# Patient Record
Sex: Female | Born: 1990 | Race: Black or African American | Hispanic: No | Marital: Single | Smoking: Former smoker
Health system: Southern US, Community
[De-identification: ages and names within clinical notes are randomized; demographics above are authoritative.]

## PROBLEM LIST (undated history)

## (undated) ENCOUNTER — Emergency Department: Discharge status: Absent without leave | Payer: BLUE CROSS/BLUE SHIELD

---

## 2016-05-28 ENCOUNTER — Emergency Department: Payer: BLUE CROSS/BLUE SHIELD

## 2016-05-28 ENCOUNTER — Emergency Department
Admission: EM | Admit: 2016-05-28 | Discharge: 2016-05-28 | Disposition: A | Discharge status: Home / Self Care | Payer: BLUE CROSS/BLUE SHIELD | Attending: Emergency Medicine | Admitting: Emergency Medicine

## 2016-05-28 ENCOUNTER — Encounter: Payer: Self-pay | Admitting: Emergency Medicine

## 2016-05-28 DIAGNOSIS — F17200 Nicotine dependence, unspecified, uncomplicated: Secondary | ICD-10-CM | POA: Diagnosis not present

## 2016-05-28 DIAGNOSIS — R140 Abdominal distension (gaseous): Secondary | ICD-10-CM | POA: Insufficient documentation

## 2016-05-28 DIAGNOSIS — O200 Threatened abortion: Secondary | ICD-10-CM

## 2016-05-28 DIAGNOSIS — Z3A01 Less than 8 weeks gestation of pregnancy: Secondary | ICD-10-CM | POA: Diagnosis not present

## 2016-05-28 DIAGNOSIS — Z79899 Other long term (current) drug therapy: Secondary | ICD-10-CM | POA: Diagnosis not present

## 2016-05-28 DIAGNOSIS — R102 Pelvic and perineal pain unspecified side: Secondary | ICD-10-CM

## 2016-05-28 DIAGNOSIS — R1030 Lower abdominal pain, unspecified: Secondary | ICD-10-CM | POA: Insufficient documentation

## 2016-05-28 DIAGNOSIS — O26891 Other specified pregnancy related conditions, first trimester: Secondary | ICD-10-CM | POA: Diagnosis present

## 2016-05-28 DIAGNOSIS — O26899 Other specified pregnancy related conditions, unspecified trimester: Secondary | ICD-10-CM

## 2016-05-28 LAB — URINALYSIS COMPLETE WITH MICROSCOPIC (ARMC ONLY)
BILIRUBIN URINE: NEGATIVE
Bacteria, UA: NONE SEEN
GLUCOSE, UA: NEGATIVE mg/dL
Hgb urine dipstick: NEGATIVE
Ketones, ur: NEGATIVE mg/dL
LEUKOCYTES UA: NEGATIVE
NITRITE: NEGATIVE
Protein, ur: NEGATIVE mg/dL
RBC / HPF: NONE SEEN RBC/hpf (ref 0–5)
SPECIFIC GRAVITY, URINE: 1.02 (ref 1.005–1.030)
pH: 7 (ref 5.0–8.0)

## 2016-05-28 LAB — BASIC METABOLIC PANEL
ANION GAP: 8 (ref 5–15)
BUN: <5 mg/dL — ABNORMAL LOW (ref 6–20)
CALCIUM: 8.9 mg/dL (ref 8.9–10.3)
CO2: 21 mmol/L — AB (ref 22–32)
Chloride: 106 mmol/L (ref 101–111)
Creatinine, Ser: 0.6 mg/dL (ref 0.44–1.00)
Glucose, Bld: 87 mg/dL (ref 65–99)
Potassium: 3.7 mmol/L (ref 3.5–5.1)
Sodium: 135 mmol/L (ref 135–145)

## 2016-05-28 LAB — CBC WITH DIFFERENTIAL/PLATELET
Basophils Absolute: 0 10*3/uL (ref 0–0.1)
Basophils Relative: 0 %
EOS PCT: 3 %
Eosinophils Absolute: 0.2 10*3/uL (ref 0–0.7)
HEMATOCRIT: 33.1 % — AB (ref 35.0–47.0)
Hemoglobin: 11.2 g/dL — ABNORMAL LOW (ref 12.0–16.0)
LYMPHS ABS: 1.2 10*3/uL (ref 1.0–3.6)
LYMPHS PCT: 24 %
MCH: 24.7 pg — ABNORMAL LOW (ref 26.0–34.0)
MCHC: 33.9 g/dL (ref 32.0–36.0)
MCV: 72.9 fL — AB (ref 80.0–100.0)
MONO ABS: 0.3 10*3/uL (ref 0.2–0.9)
MONOS PCT: 6 %
NEUTROS ABS: 3.4 10*3/uL (ref 1.4–6.5)
Neutrophils Relative %: 67 %
PLATELETS: 251 10*3/uL (ref 150–440)
RBC: 4.54 MIL/uL (ref 3.80–5.20)
RDW: 16.5 % — ABNORMAL HIGH (ref 11.5–14.5)
WBC: 5.1 10*3/uL (ref 3.6–11.0)

## 2016-05-28 LAB — HCG, QUANTITATIVE, PREGNANCY: hCG, Beta Chain, Quant, S: 134547 m[IU]/mL — ABNORMAL HIGH (ref <5)

## 2016-05-28 MED ORDER — ONDANSETRON HCL 4 MG/2ML IJ SOLN: 4.0000 mg | Freq: Once | INTRAMUSCULAR | Status: DC

## 2016-05-28 MED ORDER — ONDANSETRON 4 MG PO TBDP
ORAL_TABLET | ORAL | Status: AC
  Administered 2016-05-28: 4 mg via ORAL
  Filled 2016-05-28: qty 1

## 2016-05-28 MED ORDER — ONDANSETRON 4 MG PO TBDP
4.0000 mg | ORAL_TABLET | Freq: Once | ORAL | Status: AC
  Administered 2016-05-28: 4 mg via ORAL

## 2016-05-28 MED ORDER — DOXYLAMINE-PYRIDOXINE 10-10 MG PO TBEC: DELAYED_RELEASE_TABLET | ORAL | Status: DC

## 2016-05-28 NOTE — ED Notes (Addendum)
Pt presents with abd bloating. Had been seen at the abortion clinic after finding out she was three weeks pregnant. Pt had taken two of the abortion pills last week to pass the fetus but states is having abd cramping and bloating.  Pt denies any vaginal bleeding at this time. Pt reports that she has not yet seen any signs of passing any tissue and is concerned. Pt has notified the abortion clinic and they told her to give it some time.
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
## 2016-05-28 NOTE — ED Notes (Signed)
Patient states she is leaving with her significant other who is here with the patient's two daughters. Patient called her mother in front of this RN, but was unable to get in touch with her at her mom's work. Patient did leave a message to have the mother call her at her house. Patient had used the phone earlier to talk to her mother and inform her of the pregnancy and make arrangements to bring the patient and her daughters home.

## 2016-05-28 NOTE — ED Provider Notes (Signed)
-----------------------------------------   5:09 PM on 05/28/2016 -----------------------------------------  Care was assumed Dr. Derrill KayGoodman at roughly 4 PM pending results of Rh. Patient's blood type is B+, no need for RhoGAM. Her ultrasound showed an intrauterine pregnancy at 8 weeks estimated gestational age. She is comfortable, not complaining of pain. I discussed return precautions with her, need for close OB/GYN follow-up and she is comfortable with the discharge plan. At this point, she is considering continuing on with the pregnancy. She is requesting a nausea medicine for morning sickness which I will prescribe. DC home.  Gayla DossEryka A Nikhita Mentzel, MD 05/28/16 1710

## 2016-05-28 NOTE — ED Notes (Signed)
Patient was roomed from triage and the nurse was informed that she had a new patient.

## 2016-05-28 NOTE — ED Notes (Signed)
Patient remains at ultrasound

## 2016-05-28 NOTE — Discharge Instructions (Signed)
Please seek medical attention for any high fevers, chest pain, shortness of breath, change in behavior, persistent vomiting, bloody stool or any other new or concerning symptoms. ° ° °Threatened Miscarriage °A threatened miscarriage is when you have vaginal bleeding during your first 20 weeks of pregnancy but the pregnancy has not ended. Your doctor will do tests to make sure you are still pregnant. The cause of the bleeding may not be known. This condition does not mean your pregnancy will end. It does increase the risk of it ending (complete miscarriage). °HOME CARE  °· Make sure you keep all your doctor visits for prenatal care. °· Get plenty of rest. °· Do not have sex or use tampons if you have vaginal bleeding. °· Do not douche. °· Do not smoke or use drugs. °· Do not drink alcohol. °· Avoid caffeine. °GET HELP IF: °· You have light bleeding from your vagina. °· You have belly pain or cramping. °· You have a fever. °GET HELP RIGHT AWAY IF:  °· You have heavy bleeding from your vagina. °· You have clots of blood coming from your vagina. °· You have bad pain or cramps in your low back or belly. °· You have fever, chills, and bad belly pain. °MAKE SURE YOU:  °· Understand these instructions. °· Will watch your condition. °· Will get help right away if you are not doing well or get worse. °  °This information is not intended to replace advice given to you by your health care provider. Make sure you discuss any questions you have with your health care provider. °  °Document Released: 11/12/2008 Document Revised: 12/05/2013 Document Reviewed: 09/26/2013 °Elsevier Interactive Patient Education ©2016 [employer] ° °

## 2016-05-28 NOTE — ED Provider Notes (Signed)
[facility] Emergency Department Provider Note   ____________________________________________   I have reviewed the triage vital signs and the nursing notes.   HISTORY  Chief Complaint Bloated and Abdominal Cramping   History limited by: Not Limited   HPI Wendy Brewer is a 25 y.o. female who presents to the emergency department today because of concerns for abdominal pain and bloating. This is located primarily in the lower abdomen. One week ago the patient took abortion pills. She thinks she was roughly 3-[redacted] weeks pregnant at that time. Since taking that medication the patient has only noticed a small amount of spotting. No other significant vaginal discharge. She has developed abdominal pain and bloating. Denies any fevers.   History reviewed. No pertinent past medical history.  There are no active problems to display for this patient.   History reviewed. No pertinent past surgical history.  No current outpatient prescriptions on file.  Allergies Review of patient's allergies indicates no known allergies.  No family history on file.  Social History Social History  Substance Use Topics  . Smoking status: Current Some Day Smoker  . Smokeless tobacco: None  . Alcohol Use: Yes    Review of Systems  Constitutional: Negative for fever. Cardiovascular: Negative for chest pain. Respiratory: Negative for shortness of breath. Gastrointestinal: Positive for lower abdominal pain and bloating Genitourinary: Negative for dysuria. Musculoskeletal: Negative for back pain. Skin: Negative for rash. Neurological: Negative for headaches, focal weakness or numbness.  10-point ROS otherwise negative.  ____________________________________________   PHYSICAL EXAM:  VITAL SIGNS: ED Triage Vitals  Enc Vitals Group     BP 05/28/16 1133 117/54 mmHg     Pulse Rate 05/28/16 1133 92     Resp 05/28/16 1133 18     Temp 05/28/16 1133 98.2 F (36.8 C)      Temp Source 05/28/16 1133 Oral     SpO2 05/28/16 1133 99 %     Weight 05/28/16 1133 157 lb (71.215 kg)     Height 05/28/16 1133  (1.702 m)     Head Cir --      Peak Flow --      Pain Score 05/28/16 1133 5   Constitutional: Alert and oriented. Well appearing and in no distress. Eyes: Conjunctivae are normal. PERRL. Normal extraocular movements. ENT   Head: Normocephalic and atraumatic.   Nose: No congestion/rhinnorhea.   Mouth/Throat: Mucous membranes are moist.   Neck: No stridor. Hematological/Lymphatic/Immunilogical: No cervical lymphadenopathy. Cardiovascular: Normal rate, regular rhythm.  No murmurs, rubs, or gallops. Respiratory: Normal respiratory effort without tachypnea nor retractions. Breath sounds are clear and equal bilaterally. No wheezes/rales/rhonchi. Gastrointestinal: Soft and Tender to palpation of the lower abdomen. Nondistended. No rebound. No guarding. Genitourinary: Deferred Musculoskeletal: Normal range of motion in all extremities. No joint effusions.  No lower extremity tenderness nor edema. Neurologic:  Normal speech and language. No gross focal neurologic deficits are appreciated.  Skin:  Skin is warm, dry and intact. No rash noted. Psychiatric: Mood and affect are normal. Speech and behavior are normal. Patient exhibits appropriate insight and judgment.  ____________________________________________    LABS (pertinent positives/negatives)  Labs Reviewed  CBC WITH DIFFERENTIAL/PLATELET - Abnormal; Notable for the following:    Hemoglobin 11.2 (*)    HCT 33.1 (*)    MCV 72.9 (*)    MCH 24.7 (*)    RDW 16.5 (*)    All other components within normal limits  HCG, QUANTITATIVE, PREGNANCY - Abnormal; Notable for the following:  hCG, Beta Chain, Quant, [place] [id number] (*)    All other components within normal limits  BASIC METABOLIC PANEL - Abnormal; Notable for the following:    CO2 21 (*)    BUN <5 (*)    All other components within  normal limits  URINALYSIS COMPLETEWITH MICROSCOPIC ([facility] ONLY) - Abnormal; Notable for the following:    Color, Urine YELLOW (*)    APPearance CLEAR (*)    Squamous Epithelial / LPF 0-5 (*)    All other components within normal limits     ____________________________________________   EKG  None  ____________________________________________    RADIOLOGY  US pending  ____________________________________________   PROCEDURES  Procedure(s) performed: None  Critical Care performed: No  ____________________________________________   INITIAL IMPRESSION / ASSESSMENT AND PLAN / ED COURSE  Pertinent labs & imaging results that were available during my care of the patient were reviewed by me and considered in my medical decision making (see chart for details).  Patient presented to the emergency department today because of concerns for lower abdominal pain 3-[redacted] weeks pregnant at that time. Patient states she just noticed a little bit of spotting however no significant vaginal bleeding. On exam she is minimally tender in the suprapubic region. Beta hCG is 134,000. Will get ultrasound.  ____________________________________________   FINAL CLINICAL IMPRESSION(S) / ED DIAGNOSES  Abdominal pain  Note: This dictation was prepared with Dragon dictation. Any transcriptional errors that result from this process are unintentional    Phineas SemenGraydon Emmalou Hunger, MD 05/28/16 1512

## 2016-05-29 LAB — ABO/RH: ABO/RH(D): B POS

## 2016-08-10 ENCOUNTER — Encounter: Payer: Self-pay | Admitting: *Deleted

## 2016-08-10 ENCOUNTER — Emergency Department
Admission: EM | Admit: 2016-08-10 | Discharge: 2016-08-10 | Disposition: A | Discharge status: Home / Self Care | Payer: BLUE CROSS/BLUE SHIELD | Attending: Emergency Medicine | Admitting: Emergency Medicine

## 2016-08-10 DIAGNOSIS — F17200 Nicotine dependence, unspecified, uncomplicated: Secondary | ICD-10-CM | POA: Insufficient documentation

## 2016-08-10 DIAGNOSIS — J45998 Other asthma: Secondary | ICD-10-CM | POA: Diagnosis not present

## 2016-08-10 DIAGNOSIS — J45909 Unspecified asthma, uncomplicated: Secondary | ICD-10-CM

## 2016-08-10 DIAGNOSIS — J029 Acute pharyngitis, unspecified: Secondary | ICD-10-CM | POA: Diagnosis present

## 2016-08-10 MED ORDER — ALBUTEROL SULFATE HFA 108 (90 BASE) MCG/ACT IN AERS: 2.0000 | INHALATION_SPRAY | Freq: Four times a day (QID) | RESPIRATORY_TRACT | 2 refills | Status: DC | PRN

## 2016-08-10 MED ORDER — CETIRIZINE HCL 10 MG PO CAPS: 10.0000 mg | ORAL_CAPSULE | Freq: Every day | ORAL | 3 refills | Status: DC

## 2016-08-10 NOTE — ED Triage Notes (Addendum)
Pt ambulatory to triage.  Pt states she has a sore throat and asthma.  Pt reports not using inhaler for years.  States wheezing today and may need an inhaler again.  No acute resp distress.  Pt alert.  Speech clear.

## 2016-08-10 NOTE — ED Provider Notes (Signed)
[facility] Emergency Department Provider Note  ____________________________________________  Time seen: Approximately 8:37 PM  I have reviewed the triage vital signs and the nursing notes.   HISTORY  Chief Complaint Sore Throat and Asthma   HPI Wendy Brewer is a 25 y.o. female who presents to the emergency department for evaluation of sore throat and wheezing. She states that night before last, she was awakened by wheezing and noticed that her throat was getting sore. She states that she relieved the wheezing by standing in the bathroom while running a hot shower. She reports a history of childhood asthma, but hasn't had any issues in several years. She denies recent illness, but believes that she has been having some issues with seasonal allergies. She has not taken any medications for symptom control.   No past medical history on file.  There are no active problems to display for this patient.   No past surgical history on file.  Current Outpatient Rx  . Order #: [id number] Class: Historical Med  . Order #: [id number] Class: Print    Allergies Review of patient's allergies indicates not on file.  No family history on file.  Social History Social History  Substance Use Topics  . Smoking status: Current Some Day Smoker  . Smokeless tobacco: Never Used  . Alcohol use Yes    Review of Systems Constitutional: Negative for fever/chills ENT: Positive for sore throat. Cardiovascular: Denies chest pain. Respiratory: Negative for shortness of breath. Negative for cough. Positive for wheezing Gastrointestinal: Negative nausea,  negative for vomiting.  Negative for diarrhea.  Musculoskeletal: Negative for body aches Skin: Negative for rash. Neurological: Negative for headaches ____________________________________________   PHYSICAL EXAM:  VITAL SIGNS: ED Triage Vitals  Enc Vitals Group     BP 08/10/16 2028 116/61     Pulse Rate 08/10/16  2028 83     Resp 08/10/16 2028 18     Temp 08/10/16 2028 98.6 F (37 C)     Temp Source 08/10/16 2028 Oral     SpO2 08/10/16 2028 100 %     Weight 08/10/16 2029 144 lb (65.3 kg)     Height 08/10/16 2029 5\' 7"  (1.702 m)     Head Circumference --      Peak Flow --      Pain Score --      Pain Loc --      Pain Edu? --      Excl. in GC? --     Constitutional: Alert and oriented. Well appearing and in no acute distress. Eyes: Conjunctivae are normal. EOMI. Ears: Bilateral tympanic membranes within normal limits Nose: No congestion; no rhinnorhea. Mouth/Throat: Mucous membranes are moist.  Oropharynx with cobblestoning and exudate. Tonsils appear without exudate. Neck: No stridor.  Lymphatic: No cervical lymphadenopathy. Cardiovascular: Normal rate, regular rhythm. Grossly normal heart sounds.  Good peripheral circulation. Respiratory: Normal respiratory effort.  No retractions. Scattered faint expiratory wheezes noted. Gastrointestinal: Soft and nontender.  Musculoskeletal: FROM x 4 extremities.  Neurologic:  Normal speech and language.  Skin:  Skin is warm, dry and intact. No rash noted. Psychiatric: Mood and affect are normal. Speech and behavior are normal.  ____________________________________________   LABS (all labs ordered are listed, but only abnormal results are displayed)  Labs Reviewed - No data to display ____________________________________________  EKG   ____________________________________________  RADIOLOGY  Not indicated ____________________________________________   PROCEDURES  Procedure(s) performed: None  Critical Care performed: No  ____________________________________________   INITIAL IMPRESSION / ASSESSMENT AND  PLAN / ED COURSE  Clinical Course    Pertinent labs & imaging results that were available during my care of the patient were reviewed by me and considered in my medical decision making (see chart for details).   Patient  advised to start taking cetirizine daily. She was given a prescription for albuterol inhaler and advised to use 2 puffs every 4-6 hours if needed. She was advised to use the inhaler prior to going to bed for the next few nights. She was strongly advised to establish a primary care provider that can monitor the amount of albuterol she is having to use. She was advised to return to the emergency department for any symptom that changes or worsen if she is unable to schedule an appointment. ____________________________________________   FINAL CLINICAL IMPRESSION(S) / ED DIAGNOSES  Final diagnoses:  None    Note:  This document was prepared using Dragon voice recognition software and may include unintentional dictation errors.     Chinita PesterCari B Ahad Colarusso, FNP 08/10/16 2206    Minna AntisKevin Paduchowski, MD 08/10/16 2234

## 2016-11-23 DIAGNOSIS — F331 Major depressive disorder, recurrent, moderate: Secondary | ICD-10-CM | POA: Insufficient documentation

## 2016-12-14 ENCOUNTER — Emergency Department
Admission: EM | Admit: 2016-12-14 | Discharge: 2016-12-14 | Disposition: A | Discharge status: Home / Self Care | Payer: BLUE CROSS/BLUE SHIELD | Attending: Emergency Medicine | Admitting: Emergency Medicine

## 2016-12-14 DIAGNOSIS — O99331 Smoking (tobacco) complicating pregnancy, first trimester: Secondary | ICD-10-CM | POA: Insufficient documentation

## 2016-12-14 DIAGNOSIS — O200 Threatened abortion: Secondary | ICD-10-CM | POA: Diagnosis not present

## 2016-12-14 DIAGNOSIS — Z3A01 Less than 8 weeks gestation of pregnancy: Secondary | ICD-10-CM | POA: Insufficient documentation

## 2016-12-14 DIAGNOSIS — F17200 Nicotine dependence, unspecified, uncomplicated: Secondary | ICD-10-CM | POA: Diagnosis not present

## 2016-12-14 DIAGNOSIS — O209 Hemorrhage in early pregnancy, unspecified: Secondary | ICD-10-CM | POA: Diagnosis present

## 2016-12-14 LAB — CBC WITH DIFFERENTIAL/PLATELET
BASOS ABS: 0 10*3/uL (ref 0–0.1)
BASOS PCT: 0 %
EOS ABS: 0.2 10*3/uL (ref 0–0.7)
EOS PCT: 2 %
HCT: 35.7 % (ref 35.0–47.0)
Hemoglobin: 12.2 g/dL (ref 12.0–16.0)
Lymphocytes Relative: 18 %
Lymphs Abs: 1.5 10*3/uL (ref 1.0–3.6)
MCH: 25.1 pg — ABNORMAL LOW (ref 26.0–34.0)
MCHC: 34.2 g/dL (ref 32.0–36.0)
MCV: 73.5 fL — AB (ref 80.0–100.0)
MONO ABS: 0.5 10*3/uL (ref 0.2–0.9)
Monocytes Relative: 6 %
Neutro Abs: 6.1 10*3/uL (ref 1.4–6.5)
Neutrophils Relative %: 74 %
PLATELETS: 266 10*3/uL (ref 150–440)
RBC: 4.86 MIL/uL (ref 3.80–5.20)
RDW: 16.1 % — AB (ref 11.5–14.5)
WBC: 8.3 10*3/uL (ref 3.6–11.0)

## 2016-12-14 LAB — COMPREHENSIVE METABOLIC PANEL
ALBUMIN: 3.7 g/dL (ref 3.5–5.0)
ALT: 11 U/L — ABNORMAL LOW (ref 14–54)
AST: 18 U/L (ref 15–41)
Alkaline Phosphatase: 47 U/L (ref 38–126)
Anion gap: 6 (ref 5–15)
BUN: 8 mg/dL (ref 6–20)
CHLORIDE: 107 mmol/L (ref 101–111)
CO2: 21 mmol/L — ABNORMAL LOW (ref 22–32)
Calcium: 9.3 mg/dL (ref 8.9–10.3)
Creatinine, Ser: 0.54 mg/dL (ref 0.44–1.00)
GFR calc Af Amer: >60 mL/min (ref >60)
Glucose, Bld: 80 mg/dL (ref 65–99)
POTASSIUM: 3.8 mmol/L (ref 3.5–5.1)
SODIUM: 134 mmol/L — AB (ref 135–145)
Total Bilirubin: 0.4 mg/dL (ref 0.3–1.2)
Total Protein: 7.8 g/dL (ref 6.5–8.1)

## 2016-12-14 LAB — URINALYSIS, ROUTINE W REFLEX MICROSCOPIC
Bilirubin Urine: NEGATIVE
GLUCOSE, UA: NEGATIVE mg/dL
Hgb urine dipstick: NEGATIVE
Ketones, ur: NEGATIVE mg/dL
LEUKOCYTES UA: NEGATIVE
Nitrite: NEGATIVE
PROTEIN: NEGATIVE mg/dL
Specific Gravity, Urine: 1.02 (ref 1.005–1.030)
pH: 6 (ref 5.0–8.0)

## 2016-12-14 LAB — HCG, QUANTITATIVE, PREGNANCY: HCG, BETA CHAIN, QUANT, S: 109579 m[IU]/mL — AB (ref <5)

## 2016-12-14 NOTE — ED Triage Notes (Signed)
Reports [redacted] wks pregnant.  Cramping, noted dark blood this am after taking a bath.

## 2016-12-14 NOTE — ED Provider Notes (Signed)
[facility] Emergency Department Provider Note  ____________________________________________   I have reviewed the triage vital signs and the nursing notes.   HISTORY  Chief Complaint Abdominal Cramping   History limited by: Not Limited   HPI Wendy Brewer is a 26 y.o. female who presents to the emergency department today because of concerns for abdominal cramping and vaginal bleeding. The patient had abdominal cramping last week. She went to [facility] that time. She states that she was told she was pregnant. She states she underwent a ultrasound which showed an intrauterine pregnancy. She was doing okay until today when the cramping started again. The patient also noticed bleeding today. Patient does have a miscarriage in the past. Patient denies noticing any other tissue passage.   History reviewed. No pertinent past medical history.  There are no active problems to display for this patient.   History reviewed. No pertinent surgical history.  Prior to Admission medications   Medication Sig Start Date End Date Taking? Authorizing Provider  albuterol (PROVENTIL HFA;VENTOLIN HFA) 108 (90 Base) MCG/ACT inhaler Inhale 2 puffs into the lungs every 6 (six) hours as needed for wheezing or shortness of breath. 08/10/16   Cari B Triplett, FNP  Cetirizine HCl 10 MG CAPS Take 1 capsule (10 mg total) by mouth daily. 08/10/16   Chinita Pester, FNP    Allergies Patient has no known allergies.  No family history on file.  Social History Social History  Substance Use Topics  . Smoking status: Current Some Day Smoker  . Smokeless tobacco: Never Used  . Alcohol use Yes    Review of Systems  Constitutional: Negative for fever. Cardiovascular: Negative for chest pain. Respiratory: Negative for shortness of breath. Gastrointestinal: Positive for lower abdominal cramping. Neurological: Negative for headaches, focal weakness or  numbness.  10-point ROS otherwise negative.  ____________________________________________   PHYSICAL EXAM:  VITAL SIGNS: ED Triage Vitals  Enc Vitals Group     BP 12/14/16 1548 (!) 107/48     Pulse Rate 12/14/16 1548 99     Resp 12/14/16 1548 20     Temp 12/14/16 1548 98 F (36.7 C)     Temp Source 12/14/16 1548 Oral     SpO2 12/14/16 1548 100 %     Weight 12/14/16 1548 157 lb (71.2 kg)     Height 12/14/16 1548 5\' 7"  (1.702 m)     Head Circumference --      Peak Flow --      Pain Score 12/14/16 1549 8   Constitutional: Alert and oriented. Well appearing and in no distress. Eyes: Conjunctivae are normal. Normal extraocular movements. ENT   Head: Normocephalic and atraumatic.   Nose: No congestion/rhinnorhea.   Mouth/Throat: Mucous membranes are moist.   Neck: No stridor. Hematological/Lymphatic/Immunilogical: No cervical lymphadenopathy. Cardiovascular: Normal rate, regular rhythm.  No murmurs, rubs, or gallops. Respiratory: Normal respiratory effort without tachypnea nor retractions. Breath sounds are clear and equal bilaterally. No wheezes/rales/rhonchi. Gastrointestinal: Soft and non tender. No rebound. No guarding.  Genitourinary: Deferred Musculoskeletal: Normal range of motion in all extremities. No lower extremity edema. Neurologic:  Normal speech and language. No gross focal neurologic deficits are appreciated.  Skin:  Skin is warm, dry and intact. No rash noted. Psychiatric: Mood and affect are normal. Speech and behavior are normal. Patient exhibits appropriate insight and judgment.  ____________________________________________    LABS (pertinent positives/negatives)  Labs Reviewed  CBC WITH DIFFERENTIAL/PLATELET - Abnormal; Notable for the following:  Result Value   MCV 73.5 (*)    MCH 25.1 (*)    RDW 16.1 (*)    All other components within normal limits  COMPREHENSIVE METABOLIC PANEL - Abnormal; Notable for the following:    Sodium  134 (*)    CO2 21 (*)    ALT 11 (*)    All other components within normal limits  HCG, QUANTITATIVE, PREGNANCY - Abnormal; Notable for the following:    hCG, Beta Chain, Quant, S 109,579 (*)    All other components within normal limits  URINALYSIS, ROUTINE W REFLEX MICROSCOPIC - Abnormal; Notable for the following:    Color, Urine YELLOW (*)    APPearance CLEAR (*)    All other components within normal limits     ____________________________________________   EKG  None  ____________________________________________    RADIOLOGY  None   ____________________________________________   PROCEDURES  Procedures  ____________________________________________   INITIAL IMPRESSION / ASSESSMENT AND PLAN / ED COURSE  Pertinent labs & imaging results that were available during my care of the patient were reviewed by me and considered in my medical decision making (see chart for details).  Patient presented to the emergency department today because of vaginal bleeding and cramping at roughly [redacted] weeks pregnant. Last week the patient had abdominal cramping and was seen at outside hospital. She states she had ultrasound done at that time which ruled out ectopic and showed an intrauterine pregnancy. Patient denies any other tissue or vaginal discharge. I discussed with the patient that the bleeding could represent a miscarriage. Also discussed that it is common to have bleeding early in pregnancy. Discussed that she will need to treat this as a pregnancy and have her beta hCG level repeated in 48 hours. Review does show the patient is B+ blood type.  ____________________________________________   FINAL CLINICAL IMPRESSION(S) / ED DIAGNOSES  Final diagnoses:  Threatened miscarriage     Note: This dictation was prepared with Dragon dictation. Any transcriptional errors that result from this process are unintentional     Phineas SemenGraydon Katya Rolston, MD 12/14/16 [phone]

## 2016-12-14 NOTE — ED Triage Notes (Signed)
Patient presents to the ED via EMS from home for abdominal cramping and spotting.  Patient is approx. [redacted] weeks pregnant.  Patient appears slightly uncomfortable.

## 2016-12-14 NOTE — Discharge Instructions (Signed)
Please seek medical attention for any high fevers, chest pain, shortness of breath, change in behavior, persistent vomiting, bloody stool or any other new or concerning symptoms.  

## 2016-12-15 ENCOUNTER — Encounter: Payer: Self-pay | Admitting: Emergency Medicine

## 2016-12-15 ENCOUNTER — Emergency Department
Admission: EM | Admit: 2016-12-15 | Discharge: 2016-12-15 | Disposition: A | Discharge status: Home / Self Care | Payer: BLUE CROSS/BLUE SHIELD | Attending: Emergency Medicine | Admitting: Emergency Medicine

## 2016-12-15 DIAGNOSIS — O99330 Smoking (tobacco) complicating pregnancy, unspecified trimester: Secondary | ICD-10-CM | POA: Diagnosis not present

## 2016-12-15 DIAGNOSIS — Z3A00 Weeks of gestation of pregnancy not specified: Secondary | ICD-10-CM | POA: Diagnosis not present

## 2016-12-15 DIAGNOSIS — F17200 Nicotine dependence, unspecified, uncomplicated: Secondary | ICD-10-CM | POA: Diagnosis not present

## 2016-12-15 DIAGNOSIS — R109 Unspecified abdominal pain: Secondary | ICD-10-CM

## 2016-12-15 DIAGNOSIS — Z79899 Other long term (current) drug therapy: Secondary | ICD-10-CM | POA: Diagnosis not present

## 2016-12-15 DIAGNOSIS — O200 Threatened abortion: Secondary | ICD-10-CM | POA: Diagnosis not present

## 2016-12-15 DIAGNOSIS — O26899 Other specified pregnancy related conditions, unspecified trimester: Secondary | ICD-10-CM | POA: Diagnosis present

## 2016-12-15 MED ORDER — ACETAMINOPHEN 325 MG PO TABS
650.0000 mg | ORAL_TABLET | Freq: Once | ORAL | Status: AC
  Administered 2016-12-15: 650 mg via ORAL
  Filled 2016-12-15: qty 2

## 2016-12-15 MED ORDER — RACEPINEPHRINE HCL 2.25 % IN NEBU: 0.2500 mL | INHALATION_SOLUTION | Freq: Once | RESPIRATORY_TRACT | Status: DC

## 2016-12-15 NOTE — Discharge Instructions (Signed)
As we discussed, your workup today was reassuring.  It appears that you have no emergent medical condition at this time and that you are safe to go home; please follow up as Dr. Derrill KayGoodman recommended with a prenatal provider for further evaluation of your pregnancy and abdominal cramping.  Please return immediately to the Emergency Department if you develop any new or worsening symptoms that concern you.

## 2016-12-15 NOTE — ED Triage Notes (Addendum)
Pt presents to ED abd cramping and dizziness. Pt was seen in this ED earlier this evening for the same in addition to vaginal bleeding and was dx with possible miscarriage. Pt was told it was too early to fetus on ultrasound and was instructed to follow up with her obgyn to have labs redrawn in a couple of days. Pt reports she is no longer bleeding. Pt states she has not eaten since she came to ED and is now feeling dizzy. Pt ambulatory to triage with steady gait. No increased work of breathing or distress noted.

## 2016-12-15 NOTE — ED Notes (Signed)
Pt found sleeping soundly in lobby 

## 2016-12-15 NOTE — ED Notes (Signed)
No answer when called from lobby 

## 2016-12-15 NOTE — ED Provider Notes (Signed)
[facility] Emergency Department Provider Note  ____________________________________________   First MD Initiated Contact with Patient 12/15/16 [phone]     (approximate)  I have reviewed the triage vital signs and the nursing notes.   HISTORY  Chief Complaint Dizziness and Abdominal Pain    HPI Wendy Brewer is a 26 y.o. female Who presents for evaluation of abdominal cramping and dizziness.  She was seen a few hours ago in this emergency department and worked up thoroughly by Dr. Derrill Kay.  Reportedly she did not have a ride home so she went back out to the lobby and has had nothing to eat for many hours.  She checked back in because she was hungry and feeling a little bit dizzy from not eating.  While she was waiting room she found a comfortable place and lied down and went to sleep for a few hours.  They then found her curled up sleeping and brought her to an exam room.  She states thather symptoms are the same as before with ome intermittent mild abdominal cramping.  She has had no additional vaginal bleeding.  Eyes fever/chills, chest pain, shortness of breath, nausea, vomiting.   History reviewed. No pertinent past medical history.  There are no active problems to display for this patient.   History reviewed. No pertinent surgical history.  Prior to Admission medications   Medication Sig Start Date End Date Taking? Authorizing Provider  albuterol (PROVENTIL HFA;VENTOLIN HFA) 108 (90 Base) MCG/ACT inhaler Inhale 2 puffs into the lungs every 6 (six) hours as needed for wheezing or shortness of breath. 08/10/16   Cari B Triplett, FNP  Cetirizine HCl 10 MG CAPS Take 1 capsule (10 mg total) by mouth daily. 08/10/16   Chinita Pester, FNP    Allergies Patient has no known allergies.  History reviewed. No pertinent family history.  Social History Social History  Substance Use Topics  . Smoking status: Current Some Day Smoker  . Smokeless tobacco:  Never Used  . Alcohol use Yes    Review of Systems Constitutional: No fever/chills Cardiovascular: Denies chest pain. Respiratory: Denies shortness of breath. Gastrointestinal: Lower abdominal cramping.  No nausea, no vomiting.  No diarrhea.  No constipation. Genitourinary: Negative for dysuria.  Vaginal bleeding yesterday Musculoskeletal: Negative for back pain. Neurological: Negative for headaches, focal weakness or numbness.  10-point ROS otherwise negative.  ____________________________________________   PHYSICAL EXAM:  VITAL SIGNS: ED Triage Vitals [12/15/16 0142]  Enc Vitals Group     BP (!) 109/45     Pulse Rate 73     Resp 18     Temp 98.3 F (36.8 C)     Temp Source Oral     SpO2 100 %     Weight 157 lb (71.2 kg)     Height 5\' 7"  (1.702 m)     Head Circumference      Peak Flow      Pain Score 7     Pain Loc      Pain Edu?      Excl. in GC?     Constitutional: Alert and oriented. Well appearing and in no acute distress. Eyes: Conjunctivae are normal. PERRL. EOMI. Head: Atraumatic. Nose: No congestion/rhinnorhea. Mouth/Throat: Mucous membranes are moist.  Oropharynx non-erythematous. Neck: No stridor.  No meningeal signs.   Cardiovascular: Normal rate, regular rhythm. Good peripheral circulation. Grossly normal heart sounds. Respiratory: Normal respiratory effort.  No retractions. Lungs CTAB. Gastrointestinal: Soft and nontender. No distention.  Musculoskeletal:  No lower extremity tenderness nor edema. No gross deformities of extremities. Neurologic:  Normal speech and language. No gross focal neurologic deficits are appreciated.  Skin:  Skin is warm, dry and intact. No rash noted. Psychiatric: Mood and affect are normal. Speech and behavior are normal.  ____________________________________________   LABS (all labs ordered are listed, but only abnormal results are displayed)  Labs Reviewed - No data to  display ____________________________________________  EKG  None - EKG not ordered by ED physician ____________________________________________  RADIOLOGY   No results found.  ____________________________________________   PROCEDURES  Procedure(s) performed:   Procedures   Critical Care performed: No ____________________________________________   INITIAL IMPRESSION / ASSESSMENT AND PLAN / ED COURSE  Pertinent labs & imaging results that were available during my care of the patient were reviewed by me and considered in my medical decision making (see chart for details).  the patient ate a full meal while waiting in the exam room for me to come see her.  She is in no distress and has no abdominal tenderness to palpation.  I encouraged her to follow up as Dr. Goodman and previously Derrill Kayrecommended.  We provided a bus pass and that she can get home and I observed her walking out of the emergency department with a steady gait and no distress.    ____________________________________________  FINAL CLINICAL IMPRESSION(S) / ED DIAGNOSES  Final diagnoses:  Threatened miscarriage  Abdominal cramping     MEDICATIONS GIVEN DURING THIS VISIT:  Medications  acetaminophen (TYLENOL) tablet 650 mg (650 mg Oral Given 12/15/16 0609)     NEW OUTPATIENT MEDICATIONS STARTED DURING THIS VISIT:  Discharge Medication List as of 12/15/2016  6:22 AM      Discharge Medication List as of 12/15/2016  6:22 AM      Discharge Medication List as of 12/15/2016  6:22 AM       Note:  This document was prepared using Dragon voice recognition software and may include unintentional dictation errors.    [name], MD 12/15/16 [phone]

## 2017-01-11 DIAGNOSIS — E663 Overweight: Secondary | ICD-10-CM | POA: Insufficient documentation

## 2017-01-11 DIAGNOSIS — F1520 Other stimulant dependence, uncomplicated: Secondary | ICD-10-CM | POA: Insufficient documentation

## 2017-02-14 ENCOUNTER — Emergency Department
Admission: EM | Admit: 2017-02-14 | Discharge: 2017-02-15 | Disposition: A | Discharge status: Home / Self Care | Payer: BLUE CROSS/BLUE SHIELD | Attending: Emergency Medicine | Admitting: Emergency Medicine

## 2017-02-14 ENCOUNTER — Emergency Department: Payer: BLUE CROSS/BLUE SHIELD

## 2017-02-14 ENCOUNTER — Encounter: Payer: Self-pay | Admitting: Emergency Medicine

## 2017-02-14 DIAGNOSIS — R102 Pelvic and perineal pain: Secondary | ICD-10-CM

## 2017-02-14 DIAGNOSIS — R197 Diarrhea, unspecified: Secondary | ICD-10-CM | POA: Insufficient documentation

## 2017-02-14 DIAGNOSIS — N938 Other specified abnormal uterine and vaginal bleeding: Secondary | ICD-10-CM | POA: Insufficient documentation

## 2017-02-14 DIAGNOSIS — R1030 Lower abdominal pain, unspecified: Secondary | ICD-10-CM | POA: Diagnosis present

## 2017-02-14 DIAGNOSIS — F17200 Nicotine dependence, unspecified, uncomplicated: Secondary | ICD-10-CM | POA: Insufficient documentation

## 2017-02-14 LAB — CBC
HEMATOCRIT: 35.1 % (ref 35.0–47.0)
Hemoglobin: 11.8 g/dL — ABNORMAL LOW (ref 12.0–16.0)
MCH: 25 pg — ABNORMAL LOW (ref 26.0–34.0)
MCHC: 33.5 g/dL (ref 32.0–36.0)
MCV: 74.7 fL — ABNORMAL LOW (ref 80.0–100.0)
PLATELETS: 280 10*3/uL (ref 150–440)
RBC: 4.7 MIL/uL (ref 3.80–5.20)
RDW: 15.3 % — AB (ref 11.5–14.5)
WBC: 8.4 10*3/uL (ref 3.6–11.0)

## 2017-02-14 LAB — WET PREP, GENITAL
Clue Cells Wet Prep HPF POC: NONE SEEN
Sperm: NONE SEEN
TRICH WET PREP: NONE SEEN
YEAST WET PREP: NONE SEEN

## 2017-02-14 LAB — ABO/RH: ABO/RH(D): B POS

## 2017-02-14 LAB — HCG, QUANTITATIVE, PREGNANCY: HCG, BETA CHAIN, QUANT, S: 13 m[IU]/mL — AB (ref <5)

## 2017-02-14 MED ORDER — MORPHINE SULFATE (PF) 4 MG/ML IV SOLN
4.0000 mg | Freq: Once | INTRAVENOUS | Status: AC
  Administered 2017-02-15: 4 mg via INTRAVENOUS
  Filled 2017-02-14: qty 1

## 2017-02-14 MED ORDER — SODIUM CHLORIDE 0.9 % IV BOLUS (SEPSIS)
1000.0000 mL | Freq: Once | INTRAVENOUS | Status: AC
  Administered 2017-02-14: 1000 mL via INTRAVENOUS

## 2017-02-14 MED ORDER — ONDANSETRON HCL 4 MG/2ML IJ SOLN
4.0000 mg | Freq: Once | INTRAMUSCULAR | Status: AC
  Administered 2017-02-14: 4 mg via INTRAVENOUS
  Filled 2017-02-14: qty 2

## 2017-02-14 MED ORDER — MORPHINE SULFATE (PF) 4 MG/ML IV SOLN
4.0000 mg | Freq: Once | INTRAVENOUS | Status: AC
  Administered 2017-02-14: 4 mg via INTRAVENOUS
  Filled 2017-02-14: qty 1

## 2017-02-14 NOTE — ED Notes (Signed)
Pt requesting additional pain medication. md notified, no new orders received.  

## 2017-02-14 NOTE — ED Triage Notes (Signed)
Pt presents to ED with c/o lower abdominal pain x 1 hr and vaginal bleeding x 1 hr. Pt states hx of miscarriage in January with a D&C and is on depo. Per EMS pt c/o nausea this even and diarrhea x a few days but denies any since yesterday. EMS reports pt went to the bathroom and had a gush of vaginal bleeding into the toilet, picture brought in by EMS depict a gelatinous substance in the toilet. EMS reports intermittent vaginal pain with coughing, reports hypotension on scene with a BP 90/50, resolved with 400CC NS.

## 2017-02-14 NOTE — ED Provider Notes (Signed)
[facility] Emergency Department Provider Note  ____________________________________________   First MD Initiated Contact with Patient 02/14/17 2115     (approximate)  I have reviewed the triage vital signs and the nursing notes.   HISTORY  Chief Complaint Abdominal Pain and Vaginal Bleeding   HPI Wendy Brewer is a 26 y.o. female with a history of spontaneous miscarriage this past January requiring a D&C thereafter who is presenting with vaginal bleeding as well as lower abdominal cramping. She says just prior to arrival she had vaginal bleeding with subsequent cramping thereafter. She says the pain is moderate to severe and coming and going to the lower abdomen. She says that she is also had diarrhea over the past several days. Also with blood in her urine over the past several days. Says that she is currently on Depo-Provera shots but has been having hCG hormone levels tracked ever since a miscarriage this past January.   History reviewed. No pertinent past medical history.  There are no active problems to display for this patient.   History reviewed. No pertinent surgical history.  Prior to Admission medications   Medication Sig Start Date End Date Taking? Authorizing Provider  albuterol (PROVENTIL HFA;VENTOLIN HFA) 108 (90 Base) MCG/ACT inhaler Inhale 2 puffs into the lungs every 6 (six) hours as needed for wheezing or shortness of breath. 08/10/16   Cari B Triplett, FNP  Cetirizine HCl 10 MG CAPS Take 1 capsule (10 mg total) by mouth daily. 08/10/16   Chinita Pester, FNP    Allergies Patient has no known allergies.  No family history on file.  Social History Social History  Substance Use Topics  . Smoking status: Current Some Day Smoker    Packs/day: 0.25  . Smokeless tobacco: Never Used  . Alcohol use Yes    Review of Systems Constitutional: No fever/chills Eyes: No visual changes. ENT: No sore throat. Cardiovascular: Denies chest  pain. Respiratory: Denies shortness of breath. Gastrointestinal: No nausea, no vomiting. No constipation. Genitourinary: Negative for dysuria. Musculoskeletal: Negative for back pain. Skin: Negative for rash. Neurological: Negative for headaches, focal weakness or numbness.  10-point ROS otherwise negative.  ____________________________________________   PHYSICAL EXAM:  VITAL SIGNS: ED Triage Vitals [02/14/17 2122]  Enc Vitals Group     BP 109/62     Pulse Rate 75     Resp 20     Temp 98.4 F (36.9 C)     Temp Source Oral     SpO2 100 %     Weight 162 lb (73.5 kg)     Height 5\' 7"  (1.702 m)     Head Circumference      Peak Flow      Pain Score 10     Pain Loc      Pain Edu?      Excl. in GC?     Constitutional: Alert and oriented. Patient appears uncomfortable but without distress. Eyes: Conjunctivae are normal. PERRL. EOMI. Head: Atraumatic. Nose: No congestion/rhinnorhea. Mouth/Throat: Mucous membranes are moist.  Neck: No stridor.   Cardiovascular: Normal rate, regular rhythm. Grossly normal heart sounds.  Good peripheral circulation. Respiratory: Normal respiratory effort.  No retractions. Lungs CTAB. Gastrointestinal: Soft with moderate tenderness across the lower abdomen. No distention.  Genitourinary:  Normal external genitalia without any lesions. Speculum exam with small clots in the vault but no pooling of blood. No active bleeding from the cervix. Bimanual exam with positive CMT as well as uterine and bilateral adnexal tenderness  without any masses palpated. Musculoskeletal: No lower extremity tenderness nor edema.  No joint effusions. Neurologic:  Normal speech and language. No gross focal neurologic deficits are appreciated.  Skin:  Skin is warm, dry and intact. No rash noted. Psychiatric: Mood and affect are normal. Speech and behavior are normal.  ____________________________________________   LABS (all labs ordered are listed, but only abnormal  results are displayed)  Labs Reviewed  WET PREP, GENITAL  CHLAMYDIA/NGC RT PCR ([facility] ONLY)  HCG, QUANTITATIVE, PREGNANCY  URINALYSIS, COMPLETE (UACMP) WITH MICROSCOPIC  CBC  ABO/RH   ____________________________________________  EKG   ____________________________________________  RADIOLOGY   ____________________________________________   PROCEDURES  Procedure(s) performed:   Procedures  Critical Care performed:   ____________________________________________   INITIAL IMPRESSION / ASSESSMENT AND PLAN / ED COURSE  Pertinent labs & imaging results that were available during my care of the patient were reviewed by me and considered in my medical decision making (see chart for details).  ----------------------------------------- 11:59 PM on 02/14/2017 -----------------------------------------  Patient requesting second dose of pain medication after the first dose of morphine. Hormone level appears to be 13 which is down to 100,000 this past January. Possible still trending of hormone downward. We will further evaluate the patient with ultrasound. Signed out to Dr. Dolores FrameSung.      ____________________________________________   FINAL CLINICAL IMPRESSION(S) / ED DIAGNOSES  Pelvic pain.  dys functional uterine bleeding. Diarrhea.    NEW MEDICATIONS STARTED DURING THIS VISIT:  New Prescriptions   No medications on file     Note:  This document was prepared using Dragon voice recognition software and may include unintentional dictation errors.    Myrna Blazeravid Matthew Daniel Ritthaler, MD 02/14/17 [phone]

## 2017-02-15 DIAGNOSIS — N938 Other specified abnormal uterine and vaginal bleeding: Secondary | ICD-10-CM | POA: Diagnosis not present

## 2017-02-15 LAB — CHLAMYDIA/NGC RT PCR (ARMC ONLY)
Chlamydia Tr: NOT DETECTED
N gonorrhoeae: NOT DETECTED

## 2017-02-15 MED ORDER — OXYCODONE-ACETAMINOPHEN 5-325 MG PO TABS: 1.0000 | ORAL_TABLET | ORAL | 0 refills | Status: DC | PRN

## 2017-02-15 MED ORDER — ONDANSETRON 4 MG PO TBDP
4.0000 mg | ORAL_TABLET | Freq: Three times a day (TID) | ORAL | 0 refills | Status: DC | PRN
Start: 2017-02-15 — End: 2018-09-26

## 2017-02-15 NOTE — Discharge Instructions (Signed)
1. You may take pain and nausea medicines as needed (Percocet/Zofran). 2. Your pregnancy level (hcg) today is 13. 3. Return to the ER for worsening symptoms, persistent vomiting, difficulty breathing or other concerns.

## 2017-02-15 NOTE — ED Provider Notes (Signed)
-----------------------------------------   1:19 AM on 02/15/2017 -----------------------------------------  Ultrasound interpreted per Dr. Clovis RileyMitchell:   No visible gestational sac. Moderate volume blood or fluid in the  pelvis. Endometrium is thickened and inhomogeneous. Recommend close  follow-up.   Updated patient and family member of ultrasound results. Reports pain is much better after morphine. Has follow-up appointment at health department next week; encouraged her to call in the morning to move her appointment to this week. Strict return precautions given. Both verbalize understanding and agree with plan of care.   Irean HongJade J Julitza Rickles, MD 02/15/17 [phone]

## 2017-02-15 NOTE — ED Notes (Signed)
Pt. Going home with friend 

## 2018-05-22 IMAGING — US US OB TRANSVAGINAL
1 series · 14 of 28 positions shown · non-contrast
Comparison: None.

CLINICAL DATA: Pelvic pain for 2 weeks, positive pregnancy test



[Series 1: us ob transvaginal · 0.19mm/px · 14 of 212 slices shown]
[im 8/212]
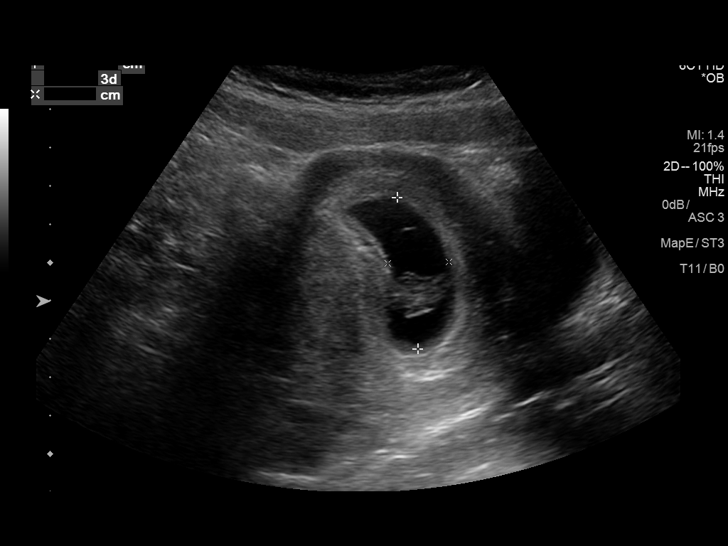
[im 24/212]
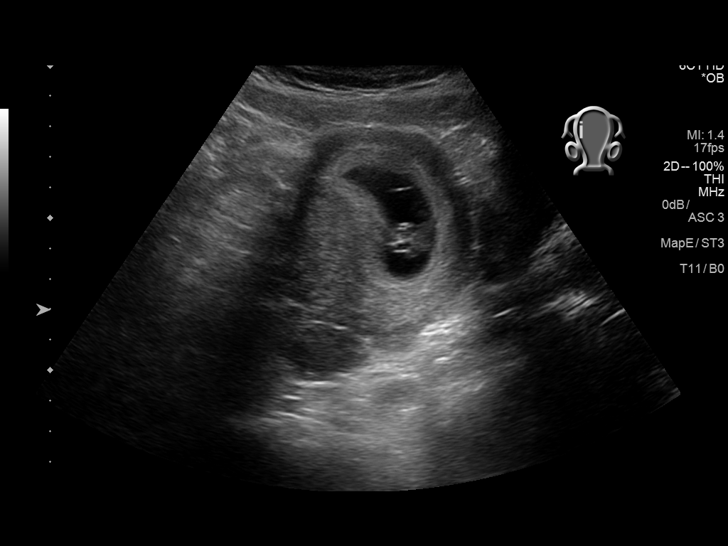
[im 40/212]
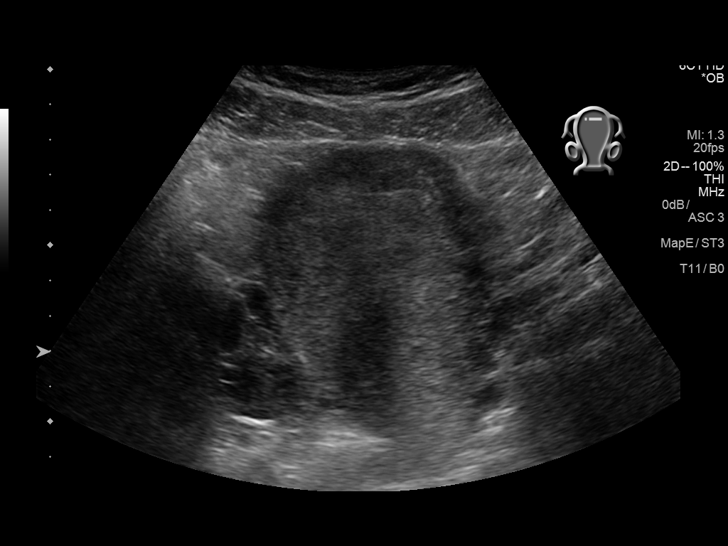
[im 55/212]
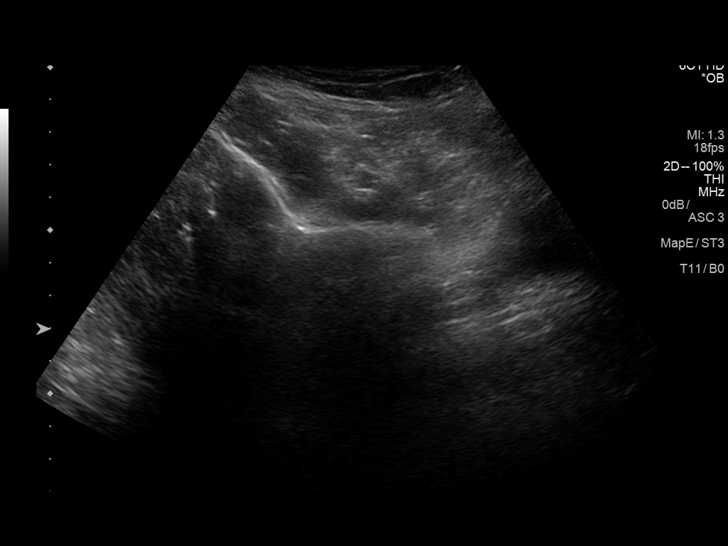
[im 71/212]
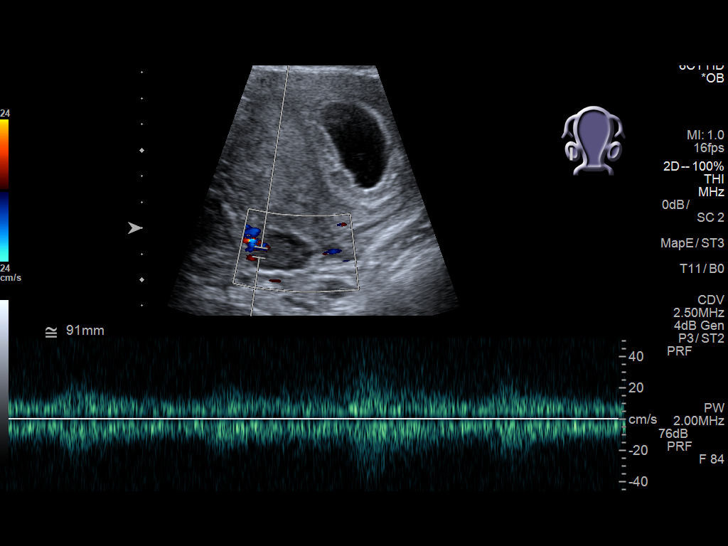
[im 86/212]
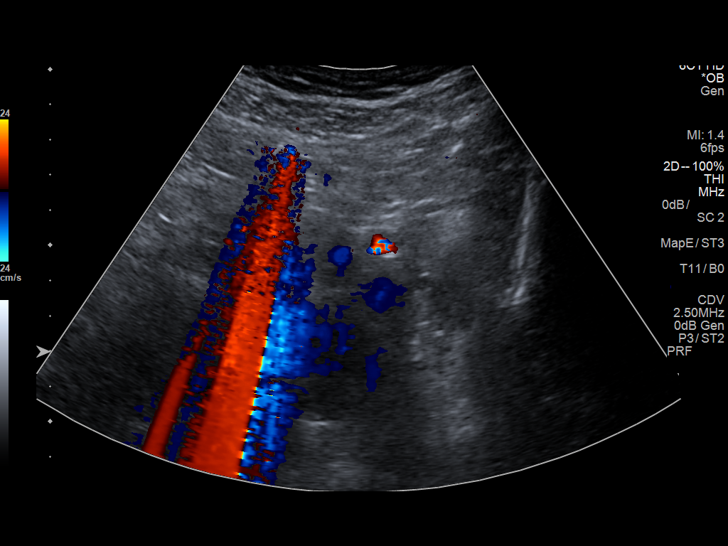
[im 102/212]
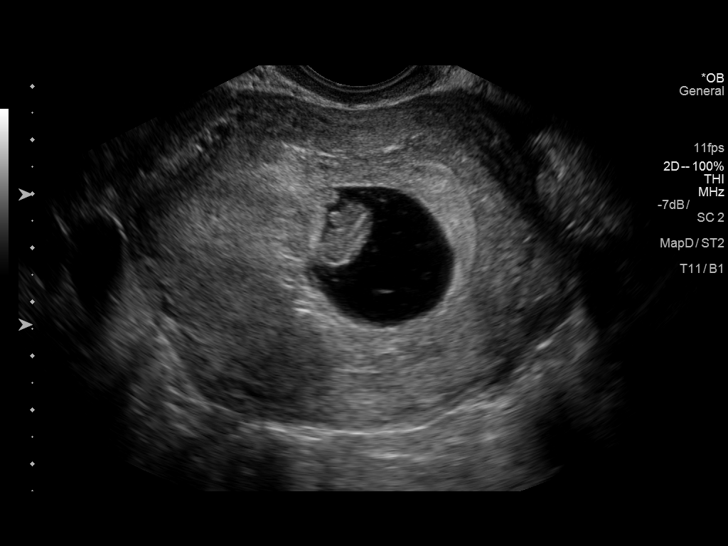
[im 118/212]
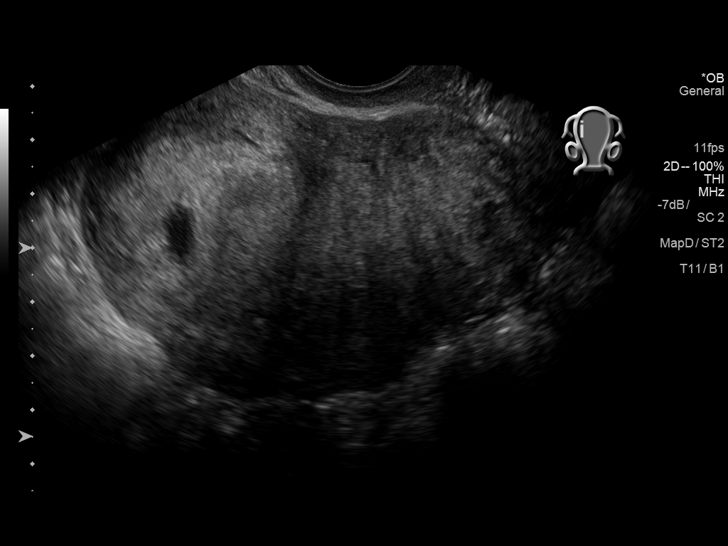
[im 133/212]
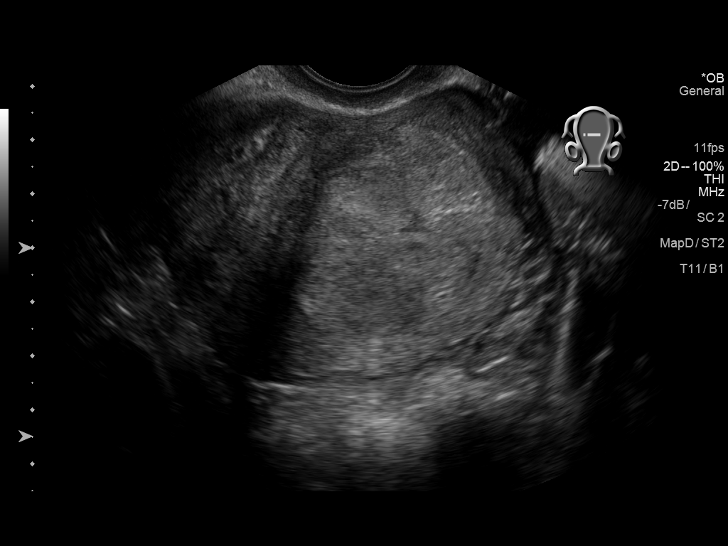
[im 149/212]
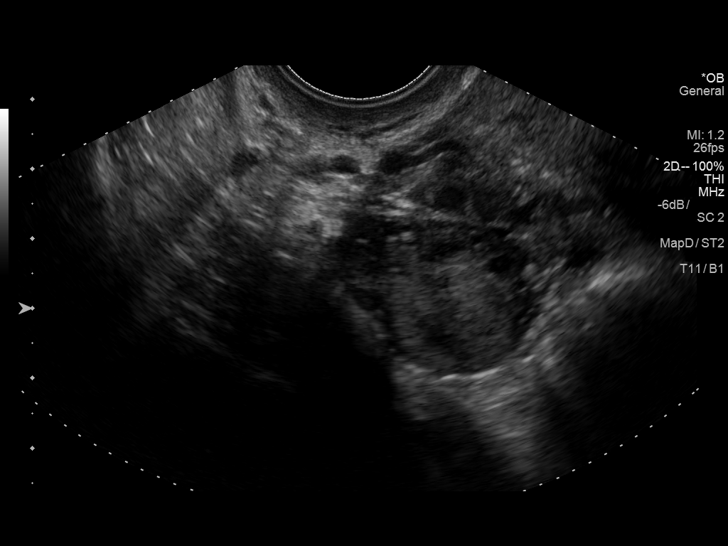
[im 165/212]
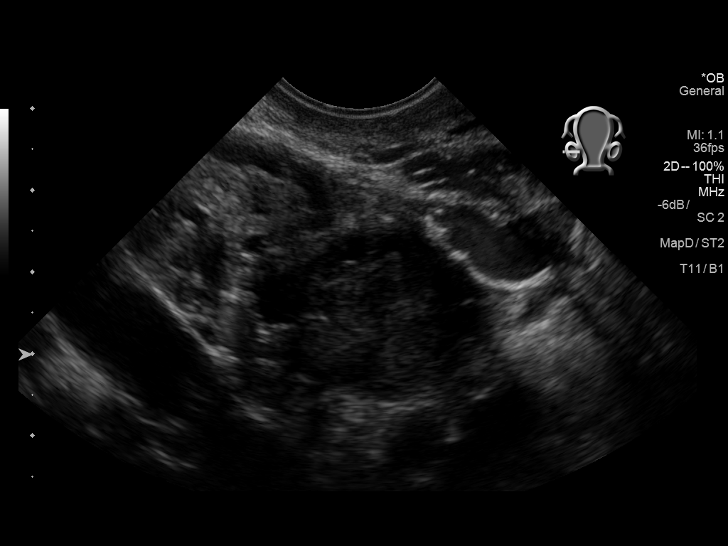
[im 180/212]
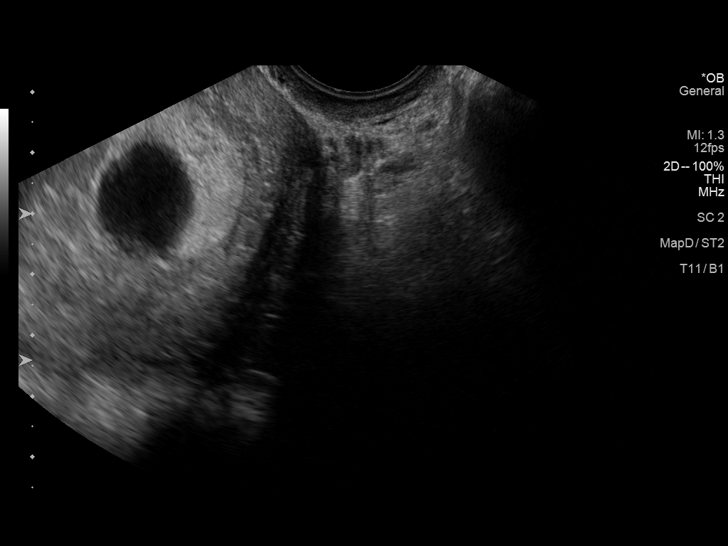
[im 196/212]
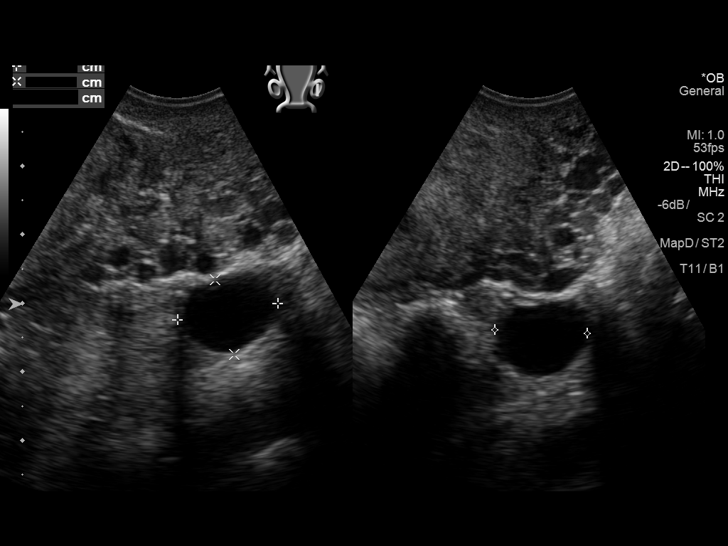
[im 212/212]
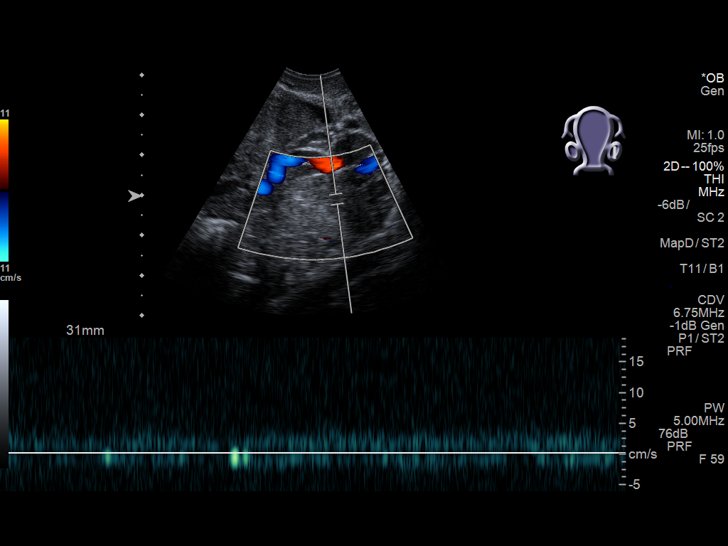

[14 of 28 positions shown; findings below may reference images not displayed]

FINDINGS: Intrauterine gestational sac: Present

Yolk sac:  Present

Embryo:  Present

Cardiac Activity: Present

Heart Rate: 162 bpm

CRL:   19.3  mm   8 w 3 d                  US EDC: 01/04/2017

Subchorionic hemorrhage:  None visualized.

Maternal uterus/adnexae: The ovaries are within normal limits
bilaterally.

Pulsed Doppler evaluation of both ovaries demonstrates normal
appearing low-resistance arterial and venous waveforms.
IMPRESSION: Single live intrauterine gestation at 8 weeks 3 days.

No acute abnormality noted.

## 2018-09-16 ENCOUNTER — Other Ambulatory Visit: Payer: Self-pay

## 2018-09-16 ENCOUNTER — Emergency Department: Payer: Medicaid Other

## 2018-09-16 ENCOUNTER — Emergency Department
Admission: EM | Admit: 2018-09-16 | Discharge: 2018-09-16 | Disposition: A | Discharge status: Home / Self Care | Payer: Medicaid Other | Attending: Emergency Medicine | Admitting: Emergency Medicine

## 2018-09-16 DIAGNOSIS — O99331 Smoking (tobacco) complicating pregnancy, first trimester: Secondary | ICD-10-CM | POA: Insufficient documentation

## 2018-09-16 DIAGNOSIS — O26891 Other specified pregnancy related conditions, first trimester: Secondary | ICD-10-CM | POA: Diagnosis not present

## 2018-09-16 DIAGNOSIS — F17210 Nicotine dependence, cigarettes, uncomplicated: Secondary | ICD-10-CM | POA: Diagnosis not present

## 2018-09-16 DIAGNOSIS — O209 Hemorrhage in early pregnancy, unspecified: Secondary | ICD-10-CM | POA: Diagnosis present

## 2018-09-16 DIAGNOSIS — O200 Threatened abortion: Secondary | ICD-10-CM | POA: Insufficient documentation

## 2018-09-16 DIAGNOSIS — R102 Pelvic and perineal pain: Secondary | ICD-10-CM | POA: Diagnosis not present

## 2018-09-16 DIAGNOSIS — Z3201 Encounter for pregnancy test, result positive: Secondary | ICD-10-CM | POA: Insufficient documentation

## 2018-09-16 DIAGNOSIS — Z3A01 Less than 8 weeks gestation of pregnancy: Secondary | ICD-10-CM | POA: Diagnosis not present

## 2018-09-16 LAB — HCG, QUANTITATIVE, PREGNANCY: HCG, BETA CHAIN, QUANT, S: 9468 m[IU]/mL — AB (ref <5)

## 2018-09-16 LAB — URINALYSIS, COMPLETE (UACMP) WITH MICROSCOPIC
BILIRUBIN URINE: NEGATIVE
Glucose, UA: NEGATIVE mg/dL
Ketones, ur: NEGATIVE mg/dL
LEUKOCYTES UA: NEGATIVE
NITRITE: NEGATIVE
Protein, ur: NEGATIVE mg/dL
Specific Gravity, Urine: 1.026 (ref 1.005–1.030)
pH: 6 (ref 5.0–8.0)

## 2018-09-16 LAB — CBC
HCT: 36.1 % (ref 35.0–47.0)
Hemoglobin: 11.8 g/dL — ABNORMAL LOW (ref 12.0–16.0)
MCH: 24.9 pg — ABNORMAL LOW (ref 26.0–34.0)
MCHC: 32.8 g/dL (ref 32.0–36.0)
MCV: 76 fL — AB (ref 80.0–100.0)
PLATELETS: 309 10*3/uL (ref 150–440)
RBC: 4.75 MIL/uL (ref 3.80–5.20)
RDW: 15.6 % — ABNORMAL HIGH (ref 11.5–14.5)
WBC: 7.9 10*3/uL (ref 3.6–11.0)

## 2018-09-16 LAB — CHLAMYDIA/NGC RT PCR (ARMC ONLY)
Chlamydia Tr: NOT DETECTED
N gonorrhoeae: NOT DETECTED

## 2018-09-16 LAB — BASIC METABOLIC PANEL
ANION GAP: 4 — AB (ref 5–15)
BUN: 9 mg/dL (ref 6–20)
CO2: 26 mmol/L (ref 22–32)
Calcium: 9.1 mg/dL (ref 8.9–10.3)
Chloride: 108 mmol/L (ref 98–111)
Creatinine, Ser: 0.72 mg/dL (ref 0.44–1.00)
GFR calc Af Amer: >60 mL/min (ref >60)
GFR calc non Af Amer: >60 mL/min (ref >60)
GLUCOSE: 88 mg/dL (ref 70–99)
Potassium: 3.7 mmol/L (ref 3.5–5.1)
Sodium: 138 mmol/L (ref 135–145)

## 2018-09-16 LAB — POCT PREGNANCY, URINE: Preg Test, Ur: POSITIVE — AB

## 2018-09-16 NOTE — ED Triage Notes (Signed)
Pt arrived via POV with reports of vaginal bleeding, pt recently found she was pregnant unsure how far along. Pt states she estimates her last period was in the middle of August.  Pt reports some lower abdominal pain as well that she describes as cramping.  Pt reports she is passing blood clots, pt reports bleeding has increased over the past 3 days, pt is not having to wear pad, but states when she wipes she sees vaginal bleeding.  Pt reports vaginal discharge and thinks she has UTI, pt reports no recent unprotected sex.

## 2018-09-16 NOTE — ED Provider Notes (Signed)
[facility] Emergency Department Provider Note       Time seen: ----------------------------------------- 3:02 PM on 09/16/2018 -----------------------------------------   I have reviewed the triage vital signs and the nursing notes.  HISTORY   Chief Complaint Vaginal Bleeding    HPI Wendy Brewer is a 27 y.o. female with no significant past medical history who presents to the ED for vaginal bleeding and pregnancy.  Patient is not sure how far along she is.  She is G6 P2 AB 3.  She also is having some cramping in her suprapubic area and some dysuria.  She denies fevers, chills or other complaints.  No past medical history on file.  There are no active problems to display for this patient.   No past surgical history on file.  Allergies Patient has no known allergies.  Social History Social History   Tobacco Use  . Smoking status: Current Some Day Smoker    Packs/day: 0.25  . Smokeless tobacco: Never Used  Substance Use Topics  . Alcohol use: Yes  . Drug use: No   Review of Systems Constitutional: Negative for fever. Cardiovascular: Negative for chest pain. Respiratory: Negative for shortness of breath. Gastrointestinal: Positive for abdominal pain Genitourinary: Positive for vaginal bleeding Musculoskeletal: Negative for back pain. Skin: Negative for rash. Neurological: Negative for headaches, focal weakness or numbness.  All systems negative/normal/unremarkable except as stated in the HPI  ____________________________________________   PHYSICAL EXAM:  VITAL SIGNS: ED Triage Vitals  Enc Vitals Group     BP 09/16/18 1344 (!) 111/53     Pulse Rate 09/16/18 1344 87     Resp 09/16/18 1344 18     Temp 09/16/18 1344 98.3 F (36.8 C)     Temp Source 09/16/18 1344 Oral     SpO2 09/16/18 1344 100 %     Weight 09/16/18 1353 157 lb (71.2 kg)     Height 09/16/18 1353 5\' 7"  (1.702 m)     Head Circumference --      Peak Flow --     Pain Score 09/16/18 1353 5     Pain Loc --      Pain Edu? --      Excl. in GC? --    Constitutional: Alert and oriented. Well appearing and in no distress. Eyes: Conjunctivae are normal. Normal extraocular movements. Cardiovascular: Normal rate, regular rhythm. No murmurs, rubs, or gallops. Respiratory: Normal respiratory effort without tachypnea nor retractions. Breath sounds are clear and equal bilaterally. No wheezes/rales/rhonchi. Gastrointestinal: Mild suprapubic tenderness, no rebound or guarding.  Normal bowel sounds, no palpable uterus Musculoskeletal: Nontender with normal range of motion in extremities. No lower extremity tenderness nor edema. Neurologic:  Normal speech and language. No gross focal neurologic deficits are appreciated.  Skin:  Skin is warm, dry and intact. No rash noted. Psychiatric: Mood and affect are normal. Speech and behavior are normal.  ____________________________________________  ED COURSE:  As part of my medical decision making, I reviewed the following data within the electronic MEDICAL RECORD NUMBER History obtained from family if available, nursing notes, old chart and ekg, as well as notes from prior ED visits. Patient presented for vaginal bleeding in early pregnancy, we will assess with labs and imaging as indicated at this time.   Procedures ____________________________________________   LABS (pertinent positives/negatives)  Labs Reviewed  URINALYSIS, COMPLETE (UACMP) WITH MICROSCOPIC - Abnormal; Notable for the following components:      Result Value   Color, Urine YELLOW (*)  APPearance CLEAR (*)    Hgb urine dipstick LARGE (*)    RBC / HPF >50 (*)    Bacteria, UA FEW (*)    All other components within normal limits  CBC - Abnormal; Notable for the following components:   Hemoglobin 11.8 (*)    MCV 76.0 (*)    MCH 24.9 (*)    RDW 15.6 (*)    All other components within normal limits  BASIC METABOLIC PANEL - Abnormal; Notable for the  following components:   Anion gap 4 (*)    All other components within normal limits  HCG, QUANTITATIVE, PREGNANCY - Abnormal; Notable for the following components:   hCG, Beta Chain, Quant, S 9,468 (*)    All other components within normal limits  POCT PREGNANCY, URINE - Abnormal; Notable for the following components:   Preg Test, Ur POSITIVE (*)    All other components within normal limits  CHLAMYDIA/NGC RT PCR ([facility] ONLY)  POC URINE PREG, ED    RADIOLOGY  Pregnancy ultrasound IMPRESSION: 1. Intrauterine gestational sac with yolk sac, 5 weeks 4 days by mean sac diameter. An embryo is not yet visualized. 2. Normal ovarian Doppler.  ____________________________________________  DIFFERENTIAL DIAGNOSIS   Threatened miscarriage, ectopic pregnancy, UTI, cervicitis, normal pregnancy  FINAL ASSESSMENT AND PLAN  Threatened miscarriage   Plan: The patient had presented for vaginal bleeding in early pregnancy. Patient's labs did not reveal any acute process. Patient's imaging revealed a 5-week 4-day IUP with gestational sac and yolk sac.  She is advised to follow-up in 2 days for repeat hCG testing.   Ulice Dash, MD   Note: This note was generated in part or whole with voice recognition software. Voice recognition is usually quite accurate but there are transcription errors that can and very often do occur. I apologize for any typographical errors that were not detected and corrected.     Emily Filbert, MD 09/16/18 [phone]

## 2018-09-22 ENCOUNTER — Emergency Department: Payer: Medicaid Other

## 2018-09-22 ENCOUNTER — Other Ambulatory Visit: Payer: Self-pay

## 2018-09-22 ENCOUNTER — Emergency Department
Admission: EM | Admit: 2018-09-22 | Discharge: 2018-09-22 | Disposition: A | Discharge status: Home / Self Care | Payer: Medicaid Other | Attending: Emergency Medicine | Admitting: Emergency Medicine

## 2018-09-22 DIAGNOSIS — F17200 Nicotine dependence, unspecified, uncomplicated: Secondary | ICD-10-CM | POA: Insufficient documentation

## 2018-09-22 DIAGNOSIS — O209 Hemorrhage in early pregnancy, unspecified: Secondary | ICD-10-CM

## 2018-09-22 DIAGNOSIS — Z3A01 Less than 8 weeks gestation of pregnancy: Secondary | ICD-10-CM | POA: Insufficient documentation

## 2018-09-22 DIAGNOSIS — O99331 Smoking (tobacco) complicating pregnancy, first trimester: Secondary | ICD-10-CM | POA: Insufficient documentation

## 2018-09-22 DIAGNOSIS — O200 Threatened abortion: Secondary | ICD-10-CM | POA: Insufficient documentation

## 2018-09-22 LAB — BASIC METABOLIC PANEL
ANION GAP: 6 (ref 5–15)
BUN: 7 mg/dL (ref 6–20)
CALCIUM: 8.9 mg/dL (ref 8.9–10.3)
CHLORIDE: 105 mmol/L (ref 98–111)
CO2: 25 mmol/L (ref 22–32)
Creatinine, Ser: 0.67 mg/dL (ref 0.44–1.00)
GFR calc Af Amer: >60 mL/min (ref >60)
GFR calc non Af Amer: >60 mL/min (ref >60)
Glucose, Bld: 126 mg/dL — ABNORMAL HIGH (ref 70–99)
Potassium: 3.6 mmol/L (ref 3.5–5.1)
SODIUM: 136 mmol/L (ref 135–145)

## 2018-09-22 LAB — CBC WITH DIFFERENTIAL/PLATELET
Abs Immature Granulocytes: 0.01 10*3/uL (ref 0.00–0.07)
Basophils Absolute: 0 10*3/uL (ref 0.0–0.1)
Basophils Relative: 0 %
Eosinophils Absolute: 0.1 10*3/uL (ref 0.0–0.5)
Eosinophils Relative: 2 %
HEMATOCRIT: 33.9 % — AB (ref 36.0–46.0)
HEMOGLOBIN: 11.1 g/dL — AB (ref 12.0–15.0)
IMMATURE GRANULOCYTES: 0 %
LYMPHS ABS: 1.4 10*3/uL (ref 0.7–4.0)
LYMPHS PCT: 23 %
MCH: 25.1 pg — ABNORMAL LOW (ref 26.0–34.0)
MCHC: 32.7 g/dL (ref 30.0–36.0)
MCV: 76.5 fL — ABNORMAL LOW (ref 80.0–100.0)
Monocytes Absolute: 0.3 10*3/uL (ref 0.1–1.0)
Monocytes Relative: 5 %
NEUTROS ABS: 4.4 10*3/uL (ref 1.7–7.7)
NEUTROS PCT: 70 %
Platelets: 260 10*3/uL (ref 150–400)
RBC: 4.43 MIL/uL (ref 3.87–5.11)
RDW: 14.5 % (ref 11.5–15.5)
WBC: 6.2 10*3/uL (ref 4.0–10.5)
nRBC: 0 % (ref 0.0–0.2)

## 2018-09-22 LAB — HCG, QUANTITATIVE, PREGNANCY: HCG, BETA CHAIN, QUANT, S: 36379 m[IU]/mL — AB (ref <5)

## 2018-09-22 NOTE — ED Provider Notes (Signed)
[facility] Emergency Department Provider Note       Time seen: ----------------------------------------- 9:40 AM on 09/22/2018 -----------------------------------------   I have reviewed the triage vital signs and the nursing notes.  HISTORY   Chief Complaint Vaginal Bleeding    HPI Wendy Brewer is a 27 y.o. female with no significant past medical history who presents to the ED for vaginal bleeding and cramping with occasional sharp pains.  Patient reports she has had pregnancies with a history of 3 miscarriages.  She was told to follow-up in 2 days after being seen here last week but has not had a chance to follow-up.  History reviewed. No pertinent past medical history.  There are no active problems to display for this patient.   History reviewed. No pertinent surgical history.  Allergies Patient has no known allergies.  Social History Social History   Tobacco Use  . Smoking status: Current Some Day Smoker    Packs/day: 0.25  . Smokeless tobacco: Never Used  Substance Use Topics  . Alcohol use: Yes  . Drug use: No   Review of Systems Constitutional: Negative for fever. Cardiovascular: Negative for chest pain. Respiratory: Negative for shortness of breath. Gastrointestinal: Negative for abdominal pain, vomiting and diarrhea. Genitourinary: Positive for vaginal bleeding and pelvic pain Musculoskeletal: Negative for back pain. Skin: Negative for rash. Neurological: Negative for headaches, focal weakness or numbness.  All systems negative/normal/unremarkable except as stated in the HPI  ____________________________________________   PHYSICAL EXAM:  VITAL SIGNS: ED Triage Vitals  Enc Vitals Group     BP 09/22/18 0932 122/66     Pulse Rate 09/22/18 0932 90     Resp 09/22/18 0932 16     Temp 09/22/18 0932 98.7 F (37.1 C)     Temp Source 09/22/18 0932 Oral     SpO2 09/22/18 0932 100 %     Weight 09/22/18 0933 157 lb (71.2  kg)     Height --      Head Circumference --      Peak Flow --      Pain Score --      Pain Loc --      Pain Edu? --      Excl. in GC? --    Constitutional: Alert and oriented. Well appearing and in no distress. Eyes: Conjunctivae are normal. Normal extraocular movements. Cardiovascular: Normal rate, regular rhythm. No murmurs, rubs, or gallops. Respiratory: Normal respiratory effort without tachypnea nor retractions. Breath sounds are clear and equal bilaterally. No wheezes/rales/rhonchi. Gastrointestinal: Soft and nontender. Normal bowel sounds Musculoskeletal: Nontender with normal range of motion in extremities. No lower extremity tenderness nor edema. Neurologic:  Normal speech and language. No gross focal neurologic deficits are appreciated.  Skin:  Skin is warm, dry and intact. No rash noted. Psychiatric: Mood and affect are normal. Speech and behavior are normal.  ____________________________________________  ED COURSE:  As part of my medical decision making, I reviewed the following data within the electronic MEDICAL RECORD NUMBER History obtained from family if available, nursing notes, old chart and ekg, as well as notes from prior ED visits. Patient presented for vaginal bleeding in early pregnancy, we will assess with labs and imaging as indicated at this time.   Procedures ____________________________________________   LABS (pertinent positives/negatives)  Labs Reviewed  CBC WITH DIFFERENTIAL/PLATELET - Abnormal; Notable for the following components:      Result Value   Hemoglobin 11.1 (*)    HCT 33.9 (*)    MCV  76.5 (*)    MCH 25.1 (*)    All other components within normal limits  BASIC METABOLIC PANEL - Abnormal; Notable for the following components:   Glucose, Bld 126 (*)    All other components within normal limits  HCG, QUANTITATIVE, PREGNANCY - Abnormal; Notable for the following components:   hCG, Beta Chain, Quant, S [id number] (*)    All other components  within normal limits    RADIOLOGY Images were viewed by me  Pelvic ultrasound IMPRESSION: 1. Single living intrauterine pregnancy measuring 5 weeks 6 days. No evident hematoma. 2. 82 beats per minute, in the bradycardic range, significance uncertain given early gestational age. ____________________________________________  DIFFERENTIAL DIAGNOSIS   Ectopic, miscarriage, UTI, normal pregnancy  FINAL ASSESSMENT AND PLAN  Threatened miscarriage   Plan: The patient had presented for continued bleeding and cramping in early pregnancy. Patient's labs did reveal a significantly increased hCG level. Patient's imaging revealed single live IUP measuring 5 weeks and 6 days with no hematoma.  Somewhat bradycardic heartbeat so this will need close follow-up.  I will advise close outpatient follow-up with her GYN doctor or she can return here.   Ulice Dash, MD   Note: This note was generated in part or whole with voice recognition software. Voice recognition is usually quite accurate but there are transcription errors that can and very often do occur. I apologize for any typographical errors that were not detected and corrected.     Emily Filbert, MD 09/22/18 [phone]

## 2018-09-22 NOTE — ED Triage Notes (Addendum)
Pt came to ED via pov c/o vaginal bleeding and cramping with occasional l sharp pains. Pt has had 5 pregnanices, history of miscarriage. Pt is approximately 7 weeks. Was seen last week here in ED.

## 2018-09-22 NOTE — ED Notes (Signed)
Pt reports vaginal bleeding with a large loss of blood this morning.

## 2018-09-26 ENCOUNTER — Encounter: Payer: Self-pay | Admitting: Obstetrics and Gynecology

## 2018-09-26 ENCOUNTER — Ambulatory Visit (INDEPENDENT_AMBULATORY_CARE_PROVIDER_SITE_OTHER): Payer: Medicaid Other | Admitting: Obstetrics and Gynecology

## 2018-09-26 VITALS — BP 110/52 | HR 83 | Ht 67.0 in | Wt 161.0 lb

## 2018-09-26 DIAGNOSIS — O3680X0 Pregnancy with inconclusive fetal viability, not applicable or unspecified: Secondary | ICD-10-CM | POA: Diagnosis not present

## 2018-09-26 DIAGNOSIS — Z3A01 Less than 8 weeks gestation of pregnancy: Secondary | ICD-10-CM | POA: Diagnosis not present

## 2018-09-26 DIAGNOSIS — Z3689 Encounter for other specified antenatal screening: Secondary | ICD-10-CM

## 2018-09-26 MED ORDER — DOXYLAMINE-PYRIDOXINE ER 20-20 MG PO TBCR: 1.0000 | EXTENDED_RELEASE_TABLET | Freq: Two times a day (BID) | ORAL | 1 refills | Status: AC

## 2018-09-26 NOTE — Progress Notes (Signed)
Obstetric Problem Visit    Chief Complaint:  Chief Complaint  Patient presents with  . ER follow up    bleeding first trimester    History of Present Illness: Patient is a 27 y.o. [id number] Unknown presenting for first trimester bleeding.  The onset of bleeding was 09/16/2018.  Is bleeding equal to or greater than normal menstrual flow:  No Any recent trauma:  No Recent intercourse:  Yes History of prior miscarriage:  Yes Prior ultrasound demonstrating IUP: emergency room visit on 09/16/18 and 10/01/18.  Prior ultrasound demonstrating viable IUP:  Yes 09/22/2018 Prior Serum HCG:  Yes 9,413mIU/mL on 09/16/2018 with rise to 36,337mIU/mL on 09/22/2018 Rh status: B positive  Review of Systems: Review of Systems  Constitutional: Negative.   Gastrointestinal: Negative.   Genitourinary: Negative.   Endo/Heme/Allergies: Does not bruise/bleed easily.    Past Medical History:  History reviewed. No pertinent past medical history.  Past Surgical History:  History reviewed. No pertinent surgical history.  Obstetric History: [id number]  Family History:  History reviewed. No pertinent family history.  Social History:  Social History   Socioeconomic History  . Marital status: Single    Spouse name: Not on file  . Number of children: Not on file  . Years of education: Not on file  . Highest education level: Not on file  Occupational History  . Not on file  Social Needs  . Financial resource strain: Not on file  . Food insecurity:    Worry: Not on file    Inability: Not on file  . Transportation needs:    Medical: Not on file    Non-medical: Not on file  Tobacco Use  . Smoking status: Current Some Day Smoker    Packs/day: 0.25  . Smokeless tobacco: Never Used  Substance and Sexual Activity  . Alcohol use: Yes  . Drug use: No  . Sexual activity: Yes    Birth control/protection: None, Injection    Comment: Missed Depo in June  Lifestyle  . Physical activity:    Days per  week: Not on file    Minutes per session: Not on file  . Stress: Not on file  Relationships  . Social connections:    Talks on phone: Not on file    Gets together: Not on file    Attends religious service: Not on file    Active member of club or organization: Not on file    Attends meetings of clubs or organizations: Not on file    Relationship status: Not on file  . Intimate partner violence:    Fear of current or ex partner: Not on file    Emotionally abused: Not on file    Physically abused: Not on file    Forced sexual activity: Not on file  Other Topics Concern  . Not on file  Social History Narrative  . Not on file    Allergies:  No Known Allergies  Medications: Prior to Admission medications   Not on File    Physical Exam Vitals: Blood pressure (!) 110/52, pulse 83, height 5\' 7"  (1.702 m), weight 161 lb (73 kg), last menstrual period 08/02/2018, unknown if currently breastfeeding. General: NAD HEENT: normocephalic, anicteric Pulmonary: No increased work of breathing, Neurologic: Grossly intact Psychiatric: mood appropriate, affect full\\Us Ob Comp Less 14 Wks  Result Date: 09/16/2018 CLINICAL DATA:  Pelvic pain in first-trimester pregnancy EXAM: OBSTETRIC <14 WK [place] AND TRANSVAGINAL OB US DOPPLER ULTRASOUND OF OVARIES TECHNIQUE: Both transabdominal and transvaginal  ultrasound examinations were performed for complete evaluation of the gestation as well as the maternal uterus, adnexal regions, and pelvic cul-de-sac. Transvaginal technique was performed to assess early pregnancy. Color and duplex Doppler ultrasound was utilized to evaluate blood flow to the ovaries. COMPARISON:  None. FINDINGS: Intrauterine gestational sac: Present Yolk sac:  Present Embryo:  Not yet seen MSD: 8.3 mm   5 w   4 d Subchorionic hemorrhage:  None visualized. Maternal uterus/adnexae: Corpus luteum on the right. Pulsed Doppler evaluation of both ovaries demonstrates normal appearing low-resistance  arterial and venous waveforms. IMPRESSION: 1. Intrauterine gestational sac with yolk sac, 5 weeks 4 days by mean sac diameter. An embryo is not yet visualized. 2. Normal ovarian Doppler. Electronically Signed   By: Marnee Spring M.D.   On: 09/16/2018 16:01   US Ob Transvaginal  Result Date: 09/16/2018 CLINICAL DATA:  Pelvic pain in first-trimester pregnancy EXAM: OBSTETRIC <14 WK [place] AND TRANSVAGINAL OB US DOPPLER ULTRASOUND OF OVARIES TECHNIQUE: Both transabdominal and transvaginal ultrasound examinations were performed for complete evaluation of the gestation as well as the maternal uterus, adnexal regions, and pelvic cul-de-sac. Transvaginal technique was performed to assess early pregnancy. Color and duplex Doppler ultrasound was utilized to evaluate blood flow to the ovaries. COMPARISON:  None. FINDINGS: Intrauterine gestational sac: Present Yolk sac:  Present Embryo:  Not yet seen MSD: 8.3 mm   5 w   4 d Subchorionic hemorrhage:  None visualized. Maternal uterus/adnexae: Corpus luteum on the right. Pulsed Doppler evaluation of both ovaries demonstrates normal appearing low-resistance arterial and venous waveforms. IMPRESSION: 1. Intrauterine gestational sac with yolk sac, 5 weeks 4 days by mean sac diameter. An embryo is not yet visualized. 2. Normal ovarian Doppler. Electronically Signed   By: Marnee Spring M.D.   On: 09/16/2018 16:01   US Pelvic Doppler (torsion R/o Or Mass Arterial Flow)  Result Date: 09/16/2018 CLINICAL DATA:  Pelvic pain in first-trimester pregnancy EXAM: OBSTETRIC <14 WK [place] AND TRANSVAGINAL OB US DOPPLER ULTRASOUND OF OVARIES TECHNIQUE: Both transabdominal and transvaginal ultrasound examinations were performed for complete evaluation of the gestation as well as the maternal uterus, adnexal regions, and pelvic cul-de-sac. Transvaginal technique was performed to assess early pregnancy. Color and duplex Doppler ultrasound was utilized to evaluate blood flow to the ovaries.  COMPARISON:  None. FINDINGS: Intrauterine gestational sac: Present Yolk sac:  Present Embryo:  Not yet seen MSD: 8.3 mm   5 w   4 d Subchorionic hemorrhage:  None visualized. Maternal uterus/adnexae: Corpus luteum on the right. Pulsed Doppler evaluation of both ovaries demonstrates normal appearing low-resistance arterial and venous waveforms. IMPRESSION: 1. Intrauterine gestational sac with yolk sac, 5 weeks 4 days by mean sac diameter. An embryo is not yet visualized. 2. Normal ovarian Doppler. Electronically Signed   By: Marnee Spring M.D.   On: 09/16/2018 16:01   US Ob Less Than 14 Weeks With Ob Transvaginal  Result Date: 09/22/2018 CLINICAL DATA:  Vaginal bleeding for 1 day EXAM: OBSTETRIC <14 WK [place] AND TRANSVAGINAL OB US TECHNIQUE: Both transabdominal and transvaginal ultrasound examinations were performed for complete evaluation of the gestation as well as the maternal uterus, adnexal regions, and pelvic cul-de-sac. Transvaginal technique was performed to assess early pregnancy. COMPARISON:  None. FINDINGS: Intrauterine gestational sac: Single Yolk sac:  Visualized. Embryo:  Visualized. Cardiac Activity: Visualized. Heart Rate: 82 bpm CRL:  2.7 mm   5 w   6 d  [place] EDC: 05/19/2019 Subchorionic hemorrhage:  None visualized. Maternal uterus/adnexae: Corpus luteum on the right. IMPRESSION: 1. Single living intrauterine pregnancy measuring 5 weeks 6 days. No evident hematoma. 2. 82 beats per minute, in the bradycardic range, significance uncertain given early gestational age. Electronically Signed   By: Marnee Spring M.D.   On: 09/22/2018 11:51     Assessment: 27 y.o. [id number] Unknown presenting for evaluation of first trimester vaginal bleeding  Plan: Problem List Items Addressed This Visit    None    Visit Diagnoses    Establish gestational age, ultrasound    -  Primary   Relevant Orders   US OB Comp Less 14 Wks   Pregnancy with inconclusive fetal viability, single or  unspecified fetus       Relevant Orders   US OB Comp Less 14 Wks   [redacted] weeks gestation of pregnancy       Relevant Orders   US OB Comp Less 14 Wks      1) First trimester bleeding - incidence and clinical course of first trimester bleeding is discussed in detail with the patient today.  Approximately 1/3 of pregnancies ending in live births experienced 1st trimester bleeding.  The amount of bleeding is variable and not necessarily predictive of outcome.  Sources may be cervical or uterine.  Subchorionic hemorrhages are a frequent concurrent findings on ultrasound and are followed expectantly.  These often absorb or regress spontaneously although risk for expansion and further disruption of the utero-placental interface leading to miscarriage is possible.  There is no clearly documented benefit to limiting or modifying activity and sexual intercourse in altering clinic course of 1st trimester bleeding.    2) If no already done will proceed with TVUS evaluation to document viability, and if uncertain viability or absence of a demonstrable IUP (and no previous documentation of IUP) will trend HCG levels.  3) The patient is Rh positive, rhogam is therefore not indicated to decrease the risk rhesus alloimmunization.    4) Routine bleeding precautions were discussed with the patient prior the conclusion of today's visit.  5) Pap and NOB labs next visit.  Samples of PNV and Bonjesta given  Vena Austria, MD, [facility], [facility]

## 2018-10-03 ENCOUNTER — Telehealth: Payer: Self-pay

## 2018-10-03 NOTE — Telephone Encounter (Signed)
Pt called after hour nurse 10/01/18 c/o vomiting, diarrhea, and abd cramping x3d.  Was seen in [facility] on the 18th - rec f/u c [place] or go to ED.  States shot for nausea did not help now did pills for nausea.

## 2018-10-03 NOTE — Telephone Encounter (Signed)
Pt does not feel good at all.  Feels like she's going to pass out and faint.  [phone]  VM is full.

## 2018-10-04 NOTE — Telephone Encounter (Signed)
Pt returned call.  C/o same sxs - feels faint, yellow d/c, can't stand for a long time, feels like she's going to faint, can't eat, vomiting, diarrhea, feels horrible, has missed work.  Adv to be seen for this problem and keep appt for u/s and appt Thurs.  Pt doesn't have a ride until Thurs.  Adv to trick her body into keeping liquids down - ice chips, icy pops, sips of clear liquids q31min x24h, then bland times 24hrs, then work up to reg diet.  Another option would be for her to go to ED when her Dad gets home.

## 2018-10-06 ENCOUNTER — Encounter: Payer: Medicaid Other | Admitting: Advanced Practice Midwife

## 2018-10-06 ENCOUNTER — Other Ambulatory Visit: Payer: Medicaid Other

## 2018-10-07 ENCOUNTER — Other Ambulatory Visit (HOSPITAL_COMMUNITY)
Admission: RE | Admit: 2018-10-07 | Discharge: 2018-10-07 | Disposition: A | Discharge status: Home / Self Care | Payer: Medicaid Other | Source: Ambulatory Visit | Attending: Obstetrics and Gynecology | Admitting: Obstetrics and Gynecology

## 2018-10-07 ENCOUNTER — Encounter: Payer: Self-pay | Admitting: Obstetrics and Gynecology

## 2018-10-07 ENCOUNTER — Ambulatory Visit (INDEPENDENT_AMBULATORY_CARE_PROVIDER_SITE_OTHER): Payer: Medicaid Other

## 2018-10-07 ENCOUNTER — Ambulatory Visit (INDEPENDENT_AMBULATORY_CARE_PROVIDER_SITE_OTHER): Payer: Medicaid Other | Admitting: Obstetrics and Gynecology

## 2018-10-07 VITALS — BP 112/74 | HR 71 | Wt 163.0 lb

## 2018-10-07 DIAGNOSIS — Z3A01 Less than 8 weeks gestation of pregnancy: Secondary | ICD-10-CM | POA: Diagnosis not present

## 2018-10-07 DIAGNOSIS — Z3A08 8 weeks gestation of pregnancy: Secondary | ICD-10-CM | POA: Diagnosis not present

## 2018-10-07 DIAGNOSIS — Z124 Encounter for screening for malignant neoplasm of cervix: Secondary | ICD-10-CM | POA: Diagnosis present

## 2018-10-07 DIAGNOSIS — O219 Vomiting of pregnancy, unspecified: Secondary | ICD-10-CM | POA: Diagnosis not present

## 2018-10-07 DIAGNOSIS — Z3480 Encounter for supervision of other normal pregnancy, unspecified trimester: Secondary | ICD-10-CM | POA: Diagnosis not present

## 2018-10-07 DIAGNOSIS — Z8759 Personal history of other complications of pregnancy, childbirth and the puerperium: Secondary | ICD-10-CM

## 2018-10-07 DIAGNOSIS — O09291 Supervision of pregnancy with other poor reproductive or obstetric history, first trimester: Secondary | ICD-10-CM

## 2018-10-07 DIAGNOSIS — Z113 Encounter for screening for infections with a predominantly sexual mode of transmission: Secondary | ICD-10-CM | POA: Diagnosis not present

## 2018-10-07 DIAGNOSIS — O3680X0 Pregnancy with inconclusive fetal viability, not applicable or unspecified: Secondary | ICD-10-CM

## 2018-10-07 DIAGNOSIS — R1110 Vomiting, unspecified: Secondary | ICD-10-CM

## 2018-10-07 LAB — POCT URINALYSIS DIPSTICK OB: Glucose, UA: NEGATIVE

## 2018-10-07 MED ORDER — ONDANSETRON 4 MG PO TBDP: 4.0000 mg | ORAL_TABLET | Freq: Four times a day (QID) | ORAL | 0 refills | Status: AC | PRN

## 2018-10-07 NOTE — Progress Notes (Signed)
New Obstetric Patient H&P    Chief Complaint: "Desires prenatal care"   History of Present Illness: Patient is a 27 y.o. Z6X0960 Not Hispanic or Latino female, presents with amenorrhea and positive home pregnancy test. Patient's last menstrual period was 07/28/2018 (approximate). and based on her  LMP, her EDD is Estimated Date of Delivery: 05/16/19 and her EGA is [redacted]w[redacted]d. Cycles are regular,   Since her LMP she claims she has experienced nausea. She denies vaginal bleeding. Her past medical history is noncontributory. Her prior pregnancies are notable for IUGR.  Since her LMP, she admits to the use of tobacco products  no She claims she has gained   no pounds since the start of her pregnancy.  There are cats in the home in the home  no  She admits close contact with children on a regular basis  yes  She has had chicken pox in the past no She has had Tuberculosis exposures, symptoms, or previously tested positive for TB   no Current or past history of domestic violence. no  Genetic Screening/Teratology Counseling: (Includes patient, baby's father, or anyone in either family with:)   1. Patient's age >/= 82 at [facility]  no 2. Thalassemia ([place], [place], Mediterranean, or Asian background): MCV<80  no 3. Neural tube defect (meningomyelocele, spina bifida, anencephaly)  no 4. Congenital heart defect  no  5. Down syndrome  no 6. Tay-Sachs (Jewish, [place]  no 7. Canavan's Disease  no 8. Sickle cell disease or trait (African)  no  9. Hemophilia or other blood disorders  no  10. Muscular dystrophy  no  11. Cystic fibrosis  no  12. Huntington's Chorea  no  13. Mental retardation/autism  no 14. Other inherited genetic or chromosomal disorder  no 15. Maternal metabolic disorder (DM, PKU, etc)  no 16. Patient or FOB with a child with a birth defect not listed above no  16a. Patient or FOB with a birth defect themselves no 17. Recurrent pregnancy loss, or stillbirth  no  18. Any  medications since LMP other than prenatal vitamins (include vitamins, supplements, OTC meds, drugs, alcohol)  no 19. Any other genetic/environmental exposure to discuss  no  Infection History:   1. Lives with someone with TB or TB exposed  no  2. Patient or partner has history of genital herpes  no 3. Rash or viral illness since LMP  no 4. History of STI (GC, CT, HPV, syphilis, HIV)  no 5. History of recent travel :  no  Other pertinent information:  no     Review of Systems:10 point review of systems negative unless otherwise noted in HPI  Past Medical History:  No past medical history on file.  Past Surgical History:  No past surgical history on file.  Gynecologic History: Patient's last menstrual period was 07/28/2018 (approximate).  Obstetric History: [id number]  Family History:  No family history on file.  Social History:  Social History   Socioeconomic History  . Marital status: Single    Spouse name: Not on file  . Number of children: Not on file  . Years of education: Not on file  . Highest education level: Not on file  Occupational History  . Not on file  Social Needs  . Financial resource strain: Not on file  . Food insecurity:    Worry: Not on file    Inability: Not on file  . Transportation needs:    Medical: Not on file    Non-medical: Not on  file  Tobacco Use  . Smoking status: Current Some Day Smoker    Packs/day: 0.25  . Smokeless tobacco: Never Used  Substance and Sexual Activity  . Alcohol use: Yes  . Drug use: No  . Sexual activity: Yes    Birth control/protection: None, Injection    Comment: Missed Depo in June  Lifestyle  . Physical activity:    Days per week: Not on file    Minutes per session: Not on file  . Stress: Not on file  Relationships  . Social connections:    Talks on phone: Not on file    Gets together: Not on file    Attends religious service: Not on file    Active member of club or organization: Not on file     Attends meetings of clubs or organizations: Not on file    Relationship status: Not on file  . Intimate partner violence:    Fear of current or ex partner: Not on file    Emotionally abused: Not on file    Physically abused: Not on file    Forced sexual activity: Not on file  Other Topics Concern  . Not on file  Social History Narrative  . Not on file    Allergies:  No Known Allergies  Medications: Prior to Admission medications   Medication Sig Start Date End Date Taking? Authorizing Provider  Doxylamine-Pyridoxine ER (BONJESTA) 20-20 MG TBCR Take 1 tablet by mouth 2 (two) times daily. Sig 1 tab po  daily at bedtime, 1 tab po daily morning 09/26/18 11/25/18  Vena Austria, MD    Physical Exam Vitals: Blood pressure 112/74, pulse 71, weight 163 lb (73.9 kg), last menstrual period 07/28/2018, unknown if currently breastfeeding. Body mass index is 25.53 kg/m.  General: NAD HEENT: normocephalic, anicteric Thyroid: no enlargement, no palpable nodules Pulmonary: No increased work of breathing, CTAB Cardiovascular: RRR, distal pulses 2+ Abdomen: NABS, soft, non-tender, non-distended.  Umbilicus without lesions.  No hepatomegaly, splenomegaly or masses palpable. No evidence of hernia  Genitourinary:  External: Normal external female genitalia.  Normal urethral meatus, normal  Bartholin's and Skene's glands.    Vagina: Normal vaginal mucosa, no evidence of prolapse.    Cervix: Grossly normal in appearance, no bleeding  Uterus:  Non-enlarged, mobile, normal contour.  No CMT  Adnexa: ovaries non-enlarged, no adnexal masses  Rectal: deferred Extremities: no edema, erythema, or tenderness Neurologic: Grossly intact Psychiatric: mood appropriate, affect full  US Ob Comp Less 14 Wks  Result Date: 10/07/2018 ULTRASOUND REPORT Location: [facility] Date of Service: 10/07/2018 Patient Name: Wendy Brewer DOB: 1991-08-31 MRN: [id number] Indications:dating Findings: Mason Jim  intrauterine pregnancy is visualized with a CRL consistent with [redacted]w[redacted]d gestation, giving an (U/S) EDD of 05/16/2019. The (U/S) EDD is not consistent with the clinically established EDD of 05/09/2019. FHR: 162 BPM CRL measurement: 18.8 mm Yolk sac is visualized and appears normal and early anatomy is normal. Amnion: visualized and appears normal --> Sub chorionic hemorrhage is seen measuring  2.75 x 0.61 cm Right Ovary is normal in appearance. Left Ovary is normal appearance. Corpus luteal cyst:  is not visualized Survey of the adnexa demonstrates no adnexal masses. There is no free peritoneal fluid in the cul de sac. Impression: 1. [redacted]w[redacted]d Viable Singleton Intrauterine pregnancy by U/S. 2. (U/S) EDD is 05/16/2019. 3.  Sub chorionic hemorrhage is seen measuring  2.75 x 0.61 cm. Recommendations: 1.Clinical correlation with the patient's History and Physical Exam. Mital bahen P Patel, RDMS There is a  viable singleton gestation.  The fetal biometry correlates with established dating. Detailed evaluation of the fetal anatomy is precluded by early gestational age.  It must be noted that a normal ultrasound particular at this early gestational age is unable to rule out fetal aneuploidy, risk of first trimester miscarriage, or anatomic birth defects.  Incidental note is made of a subchorionic hemorrhage. Based on ultrasound examination today, EDC changed to 05/16/2019 [place] Criteria for Re-dating Pregnancy Dating Dating Discrepancy [redacted]w[redacted]d >5 days [redacted]w[redacted]d - [redacted]w[redacted]d >7 days [redacted]w[redacted]d - [redacted]w[redacted]d >7 days [redacted]w[redacted]d - [redacted]w[redacted]d >10 days [redacted]w[redacted]d - [redacted]w[redacted]d >14 days [redacted]w[redacted]d and above >21 days ACOG Committee Opinion 700 May 2017 "Methods for Estimating the Due Date" Vena Austria, MD, [facility], [facility] 10/07/2018, 1:37 PM   US Ob Comp Less 14 Wks  Result Date: 09/16/2018 CLINICAL DATA:  Pelvic pain in first-trimester pregnancy EXAM: OBSTETRIC <14 WK [place] AND TRANSVAGINAL OB US DOPPLER ULTRASOUND OF OVARIES TECHNIQUE: Both  transabdominal and transvaginal ultrasound examinations were performed for complete evaluation of the gestation as well as the maternal uterus, adnexal regions, and pelvic cul-de-sac. Transvaginal technique was performed to assess early pregnancy. Color and duplex Doppler ultrasound was utilized to evaluate blood flow to the ovaries. COMPARISON:  None. FINDINGS: Intrauterine gestational sac: Present Yolk sac:  Present Embryo:  Not yet seen MSD: 8.3 mm   5 w   4 d Subchorionic hemorrhage:  None visualized. Maternal uterus/adnexae: Corpus luteum on the right. Pulsed Doppler evaluation of both ovaries demonstrates normal appearing low-resistance arterial and venous waveforms. IMPRESSION: 1. Intrauterine gestational sac with yolk sac, 5 weeks 4 days by mean sac diameter. An embryo is not yet visualized. 2. Normal ovarian Doppler. Electronically Signed   By: Marnee Spring M.D.   On: 09/16/2018 16:01   US Ob Transvaginal  Result Date: 09/16/2018 CLINICAL DATA:  Pelvic pain in first-trimester pregnancy EXAM: OBSTETRIC <14 WK [place] AND TRANSVAGINAL OB US DOPPLER ULTRASOUND OF OVARIES TECHNIQUE: Both transabdominal and transvaginal ultrasound examinations were performed for complete evaluation of the gestation as well as the maternal uterus, adnexal regions, and pelvic cul-de-sac. Transvaginal technique was performed to assess early pregnancy. Color and duplex Doppler ultrasound was utilized to evaluate blood flow to the ovaries. COMPARISON:  None. FINDINGS: Intrauterine gestational sac: Present Yolk sac:  Present Embryo:  Not yet seen MSD: 8.3 mm   5 w   4 d Subchorionic hemorrhage:  None visualized. Maternal uterus/adnexae: Corpus luteum on the right. Pulsed Doppler evaluation of both ovaries demonstrates normal appearing low-resistance arterial and venous waveforms. IMPRESSION: 1. Intrauterine gestational sac with yolk sac, 5 weeks 4 days by mean sac diameter. An embryo is not yet visualized. 2. Normal ovarian Doppler.  Electronically Signed   By: Marnee Spring M.D.   On: 09/16/2018 16:01   US Pelvic Doppler (torsion R/o Or Mass Arterial Flow)  Result Date: 09/16/2018 CLINICAL DATA:  Pelvic pain in first-trimester pregnancy EXAM: OBSTETRIC <14 WK [place] AND TRANSVAGINAL OB US DOPPLER ULTRASOUND OF OVARIES TECHNIQUE: Both transabdominal and transvaginal ultrasound examinations were performed for complete evaluation of the gestation as well as the maternal uterus, adnexal regions, and pelvic cul-de-sac. Transvaginal technique was performed to assess early pregnancy. Color and duplex Doppler ultrasound was utilized to evaluate blood flow to the ovaries. COMPARISON:  None. FINDINGS: Intrauterine gestational sac: Present Yolk sac:  Present Embryo:  Not yet seen MSD: 8.3 mm   5 w   4 d Subchorionic hemorrhage:  None visualized. Maternal  uterus/adnexae: Corpus luteum on the right. Pulsed Doppler evaluation of both ovaries demonstrates normal appearing low-resistance arterial and venous waveforms. IMPRESSION: 1. Intrauterine gestational sac with yolk sac, 5 weeks 4 days by mean sac diameter. An embryo is not yet visualized. 2. Normal ovarian Doppler. Electronically Signed   By: Marnee Spring M.D.   On: 09/16/2018 16:01   US Ob Less Than 14 Weeks With Ob Transvaginal  Result Date: 09/22/2018 CLINICAL DATA:  Vaginal bleeding for 1 day EXAM: OBSTETRIC <14 WK [place] AND TRANSVAGINAL OB US TECHNIQUE: Both transabdominal and transvaginal ultrasound examinations were performed for complete evaluation of the gestation as well as the maternal uterus, adnexal regions, and pelvic cul-de-sac. Transvaginal technique was performed to assess early pregnancy. COMPARISON:  None. FINDINGS: Intrauterine gestational sac: Single Yolk sac:  Visualized. Embryo:  Visualized. Cardiac Activity: Visualized. Heart Rate: 82 bpm CRL:  2.7 mm   5 w   6 d                  [place] EDC: 05/19/2019 Subchorionic hemorrhage:  None visualized. Maternal uterus/adnexae: Corpus  luteum on the right. IMPRESSION: 1. Single living intrauterine pregnancy measuring 5 weeks 6 days. No evident hematoma. 2. 82 beats per minute, in the bradycardic range, significance uncertain given early gestational age. Electronically Signed   By: Marnee Spring M.D.   On: 09/22/2018 11:51     Assessment: 27 y.o. [id number] at [redacted]w[redacted]d presenting to initiate prenatal care  Plan: 1) Avoid alcoholic beverages. 2) Patient encouraged not to smoke.  3) Discontinue the use of all non-medicinal drugs and chemicals.  4) Take prenatal vitamins daily.  5) Nutrition, food safety (fish, cheese advisories, and high nitrite foods) and exercise discussed. 6) Hospital and practice style discussed with cross coverage system.  7) Genetic Screening, such as with 1st Trimester Screening, cell free fetal DNA, AFP testing, and Ultrasound, as well as with amniocentesis and CVS as appropriate, is discussed with patient. At the conclusion of today's visit patient requested genetic testing 8) Patient is asked about travel to areas at risk for the Zika virus, and counseled to avoid travel and exposure to mosquitoes or sexual partners who may have themselves been exposed to the virus. Testing is discussed, and will be ordered as appropriate.   Vena Austria, MD, [facility], [facility] 10/07/2018, 1:42 PM

## 2018-10-07 NOTE — Patient Instructions (Signed)

## 2018-10-07 NOTE — Progress Notes (Signed)
NOB Dating scan today N&V Diarrhea Discharge w/odor dizziness

## 2018-10-09 LAB — URINE CULTURE: Organism ID, Bacteria: NO GROWTH

## 2018-10-10 LAB — HEMOGLOBINOPATHY EVALUATION
HEMOGLOBIN A2 QUANTITATION: 2.3 % (ref 1.8–3.2)
HEMOGLOBIN F QUANTITATION: 0 % (ref 0.0–2.0)
HGB C: 0 %
HGB S: 0 %
HGB VARIANT: 0 %
Hgb A: 97.7 % (ref 96.4–98.8)

## 2018-10-10 LAB — RPR+RH+ABO+RUB AB+AB SCR+CB...
ANTIBODY SCREEN: NEGATIVE
HEMATOCRIT: 34.2 % (ref 34.0–46.6)
HEP B S AG: NEGATIVE
HIV SCREEN 4TH GENERATION: NONREACTIVE
Hemoglobin: 11.7 g/dL (ref 11.1–15.9)
MCH: 25.5 pg — ABNORMAL LOW (ref 26.6–33.0)
MCHC: 34.2 g/dL (ref 31.5–35.7)
MCV: 75 fL — AB (ref 79–97)
Platelets: 313 10*3/uL (ref 150–450)
RBC: 4.59 x10E6/uL (ref 3.77–5.28)
RDW: 14.2 % (ref 12.3–15.4)
RH TYPE: POSITIVE
RPR Ser Ql: NONREACTIVE
Rubella Antibodies, IGG: 1.52 index (ref >0.99)
Varicella zoster IgG: 2005 index (ref >165)
WBC: 7.6 10*3/uL (ref 3.4–10.8)

## 2018-10-11 LAB — CYTOLOGY - PAP
Chlamydia: NEGATIVE
DIAGNOSIS: NEGATIVE
Neisseria Gonorrhea: NEGATIVE

## 2018-10-12 DIAGNOSIS — Z124 Encounter for screening for malignant neoplasm of cervix: Secondary | ICD-10-CM | POA: Insufficient documentation

## 2018-10-12 DIAGNOSIS — Z8759 Personal history of other complications of pregnancy, childbirth and the puerperium: Secondary | ICD-10-CM | POA: Insufficient documentation

## 2018-10-12 DIAGNOSIS — R1110 Vomiting, unspecified: Secondary | ICD-10-CM | POA: Insufficient documentation

## 2018-10-12 DIAGNOSIS — Z3480 Encounter for supervision of other normal pregnancy, unspecified trimester: Secondary | ICD-10-CM | POA: Insufficient documentation

## 2018-10-18 ENCOUNTER — Telehealth: Payer: Self-pay | Admitting: Obstetrics and Gynecology

## 2018-10-18 ENCOUNTER — Other Ambulatory Visit: Payer: Self-pay | Admitting: Obstetrics and Gynecology

## 2018-10-18 ENCOUNTER — Encounter: Payer: Self-pay | Admitting: Obstetrics and Gynecology

## 2018-10-18 DIAGNOSIS — O23599 Infection of other part of genital tract in pregnancy, unspecified trimester: Secondary | ICD-10-CM

## 2018-10-18 DIAGNOSIS — A5901 Trichomonal vulvovaginitis: Secondary | ICD-10-CM | POA: Insufficient documentation

## 2018-10-18 NOTE — Telephone Encounter (Signed)
Patient is returning Missed call. Please advise

## 2018-10-18 NOTE — Progress Notes (Signed)
Pap + Trichomonas voicemailbox not set up, mychart not active

## 2018-10-20 ENCOUNTER — Other Ambulatory Visit: Payer: Self-pay | Admitting: Obstetrics and Gynecology

## 2018-10-20 MED ORDER — METRONIDAZOLE 500 MG PO TABS: 2000.0000 mg | ORAL_TABLET | Freq: Once | ORAL | 1 refills | Status: AC

## 2018-11-02 ENCOUNTER — Encounter: Payer: Medicaid Other | Admitting: Maternal Newborn

## 2018-11-07 ENCOUNTER — Encounter: Payer: Medicaid Other | Admitting: Obstetrics and Gynecology

## 2018-11-15 ENCOUNTER — Encounter: Payer: Self-pay | Admitting: Maternal Newborn

## 2018-11-15 ENCOUNTER — Ambulatory Visit (INDEPENDENT_AMBULATORY_CARE_PROVIDER_SITE_OTHER): Payer: Medicaid Other | Admitting: Maternal Newborn

## 2018-11-15 VITALS — BP 90/60 | Wt 164.0 lb

## 2018-11-15 DIAGNOSIS — Z332 Encounter for elective termination of pregnancy: Secondary | ICD-10-CM

## 2018-11-15 DIAGNOSIS — O039 Complete or unspecified spontaneous abortion without complication: Secondary | ICD-10-CM

## 2018-11-15 DIAGNOSIS — F4321 Adjustment disorder with depressed mood: Secondary | ICD-10-CM

## 2018-11-15 DIAGNOSIS — Z3A14 14 weeks gestation of pregnancy: Secondary | ICD-10-CM

## 2018-11-15 LAB — POCT URINALYSIS DIPSTICK OB
Glucose, UA: NEGATIVE
PROTEIN: NEGATIVE

## 2018-11-15 NOTE — Patient Instructions (Signed)
Therapists/Counselors/Psychologists   Karen Canada, LPC  & Michelle Van Horton    [phone]        [address]       [place], [place] [address]        Gary Bailey, CSW [phone] [address] [place], [place] [address]  Julia Tabor, LPC        [phone]        [address]      [place], [place] [address]        [place] Bryant, [place] [phone] [address] [place], [place] [address]  Joanna Warren, LMFT       [phone]        [address]       [place], [place] [address]        Tina Thompson [phone] [address] [place], [place] [address]  Amanda Miller        [phone]        [address]       [place], [place] [address]        Bart McCormick [phone] [address] [place], [place] [address]  Cristin Saffo, PsyD       [phone]        [address]       [place], [place] [address]        Cheryl Lawson, LPC [phone] [address]  [place], [place] [address]   Courtney Jones        [phone]        [address]      [place], [place] [address]         Laura Ellington [facility] [phone] [email or web address]   Sation Konchella       [phone]        [address]       , [place] [address]        Carmen Bork [facility] [phone] [email or web address]   ADD/ADHD Testing:   Peter Lolli, PhD [address] [place], [place] [address] [phone] BCBS   [facility] in [place], [place]   Mary Heiney, MS LPA [address] [place], [place] [address] Phone: [phone] Fax: [phone] UMR , BCBS, Medicaid   Louise Lampron Welker [address] [place], [place] [address] [phone]    [facility] in [place], [place]   ----------------------------------------------------------   Neuropsych Testing Dr Kristine Herfkens [address] [place], [place] P: [phone] F: [phone] [email or web address]    [facility]  Liz Leopold [place] Therapist: [phone]     Eating Disorders   [facility]  [email or web address] [phone]  Heather [facility] [phone], ext [phone]   Substance Abuse Treatment   [facility], [place], [place] [facility] [place], [place] [facility] [place], [place], [place], [place] [facility], , [facility], [place], [place]   Charity Care Program:  [email or web address] 
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
## 2018-11-21 DIAGNOSIS — Z1388 Encounter for screening for disorder due to exposure to contaminants: Secondary | ICD-10-CM | POA: Diagnosis not present

## 2018-11-21 DIAGNOSIS — Z3009 Encounter for other general counseling and advice on contraception: Secondary | ICD-10-CM | POA: Diagnosis not present

## 2018-11-21 DIAGNOSIS — Z0389 Encounter for observation for other suspected diseases and conditions ruled out: Secondary | ICD-10-CM | POA: Diagnosis not present

## 2018-11-28 ENCOUNTER — Telehealth: Payer: Self-pay

## 2018-11-28 DIAGNOSIS — Z3009 Encounter for other general counseling and advice on contraception: Secondary | ICD-10-CM | POA: Diagnosis not present

## 2018-11-28 DIAGNOSIS — Z1388 Encounter for screening for disorder due to exposure to contaminants: Secondary | ICD-10-CM | POA: Diagnosis not present

## 2018-11-28 DIAGNOSIS — Z0389 Encounter for observation for other suspected diseases and conditions ruled out: Secondary | ICD-10-CM | POA: Diagnosis not present

## 2018-11-28 DIAGNOSIS — Z309 Encounter for contraceptive management, unspecified: Secondary | ICD-10-CM | POA: Diagnosis not present

## 2018-11-28 DIAGNOSIS — Z124 Encounter for screening for malignant neoplasm of cervix: Secondary | ICD-10-CM | POA: Diagnosis not present

## 2018-11-28 LAB — HM PAP SMEAR: HM Pap smear: NEGATIVE

## 2018-11-28 LAB — HM HIV SCREENING LAB: HM HIV Screening: NEGATIVE

## 2018-11-28 NOTE — Telephone Encounter (Signed)
Pt wants to know the name of STD and name of antibx.  Also, can we send another rx to pharm as they are not showing it in their computer.  [id number]

## 2018-11-30 ENCOUNTER — Other Ambulatory Visit: Payer: Self-pay | Admitting: Maternal Newborn

## 2018-11-30 MED ORDER — METRONIDAZOLE 500 MG PO TABS: 2000.0000 mg | ORAL_TABLET | Freq: Once | ORAL | 0 refills | Status: AC

## 2018-11-30 NOTE — Telephone Encounter (Signed)
Pt aware.

## 2018-11-30 NOTE — Progress Notes (Signed)
Rx resent at patient's request.

## 2018-11-30 NOTE — Telephone Encounter (Signed)
I did not see her for this, but from the record it looks like trichomoniasis. I will send another Rx.

## 2019-02-08 IMAGING — US US OB TRANSVAGINAL
1 series · 13 of 28 positions shown · non-contrast
Comparison: None.

CLINICAL DATA: Lower abdominal pain and vaginal bleeding for 1
hour. Quantitative HCG is 13.



[Series 1: us ob transvaginal · 0.26mm/px · 96 acquisitions, 13 frames shown]
[im 4/96]
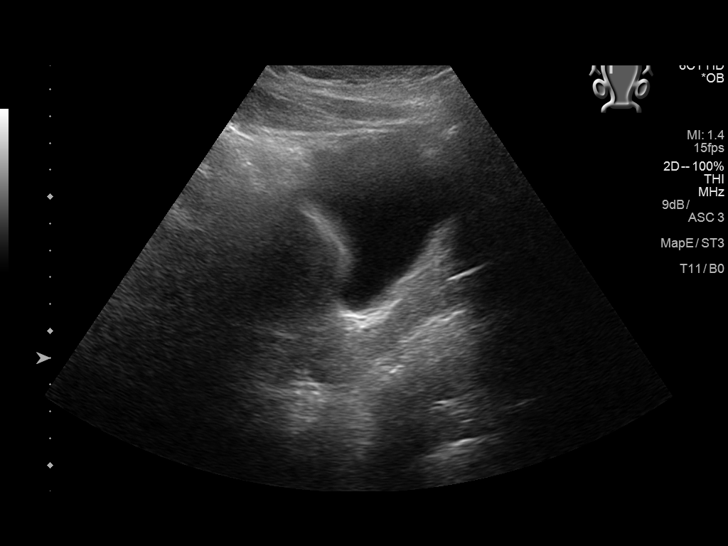
[im 11/96]
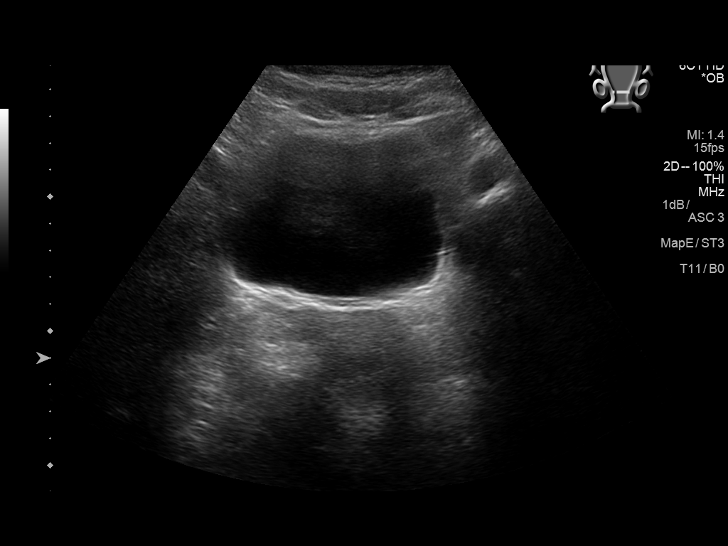
[im 18/96]
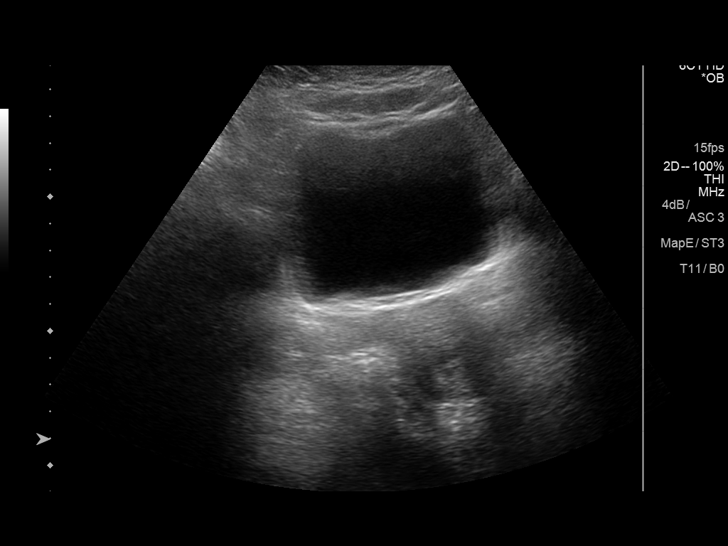
[im 25/96]
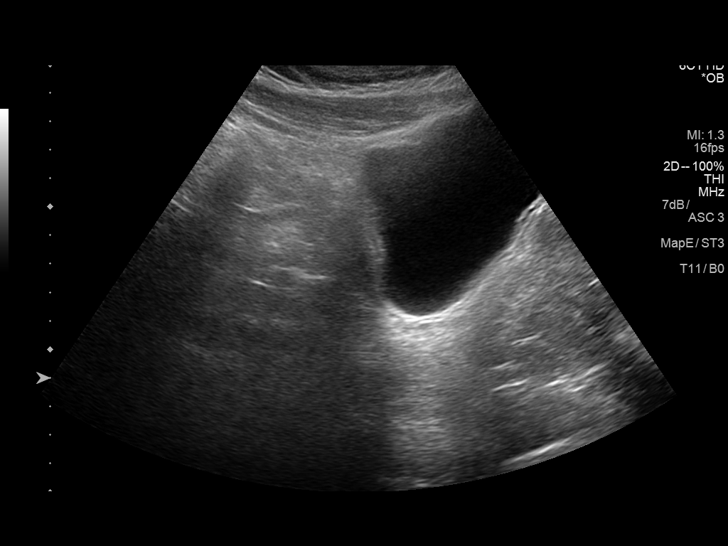
[im 32/96]
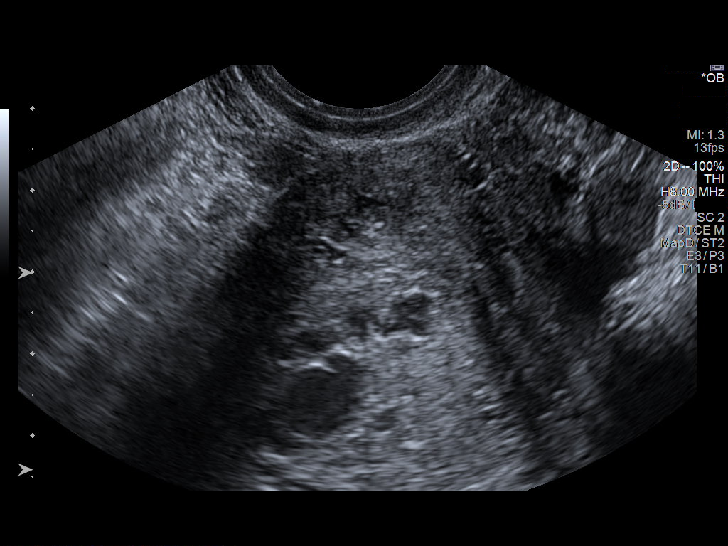
[im 39/96]
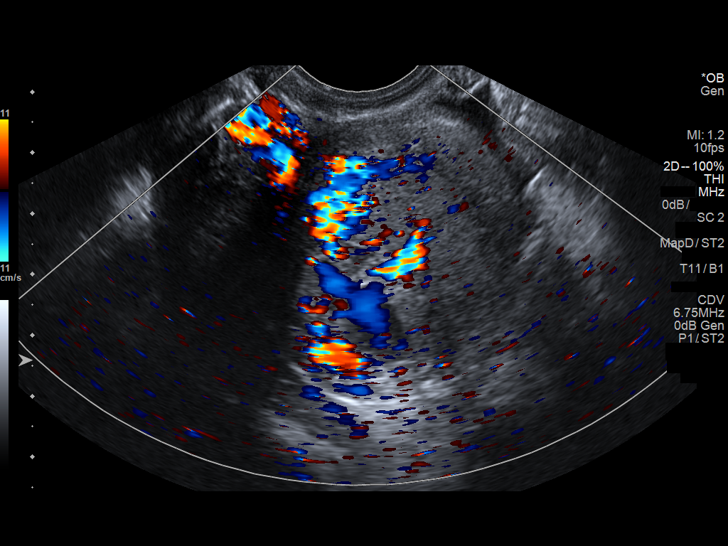
[im 50/96]
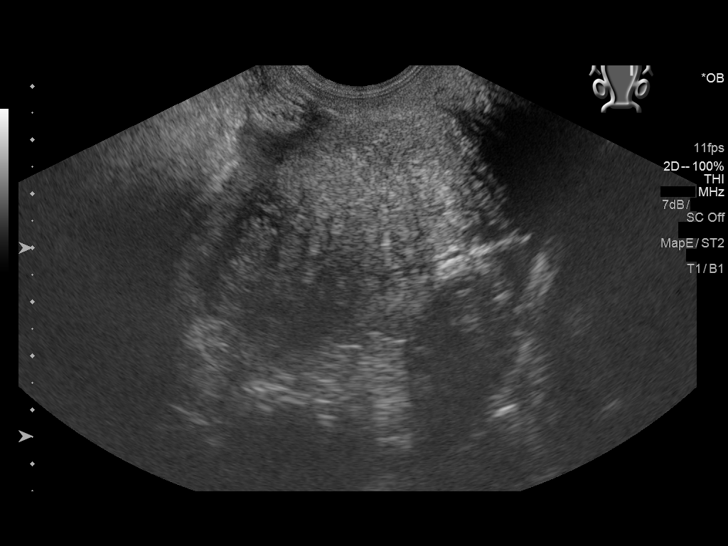
[im 57/96]
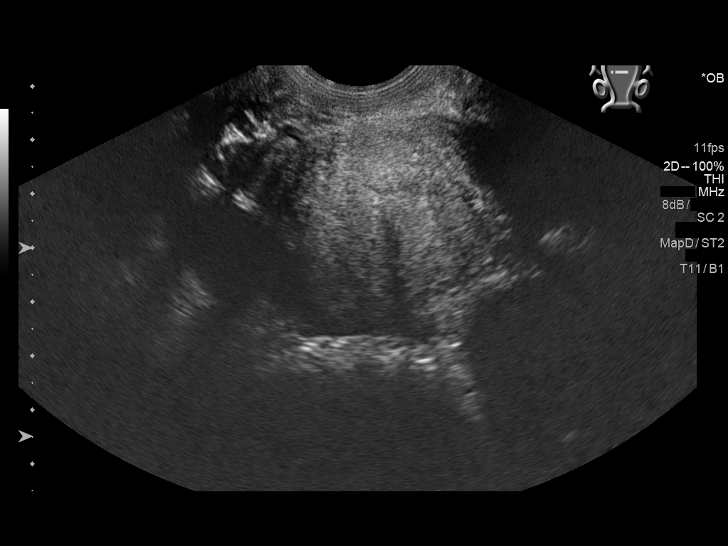
[im 64/96]
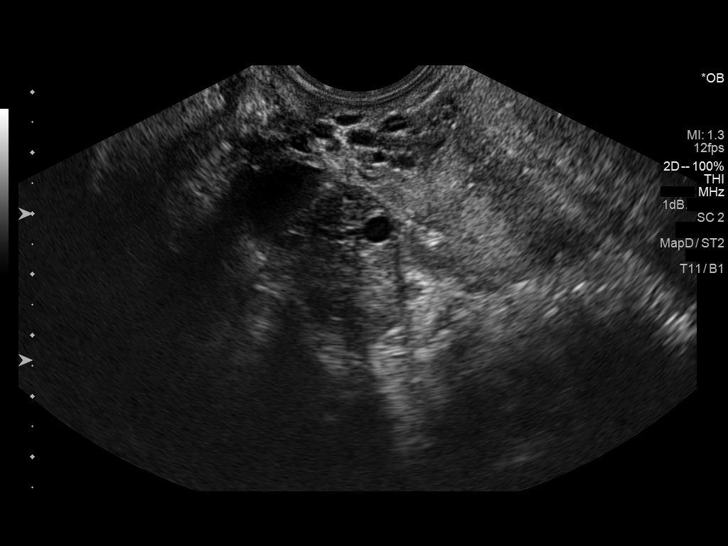
[im 71/96]
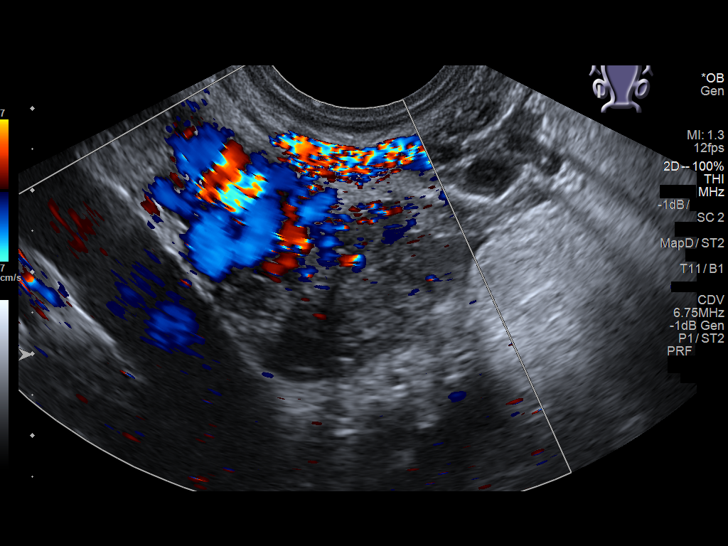
[im 78/96]
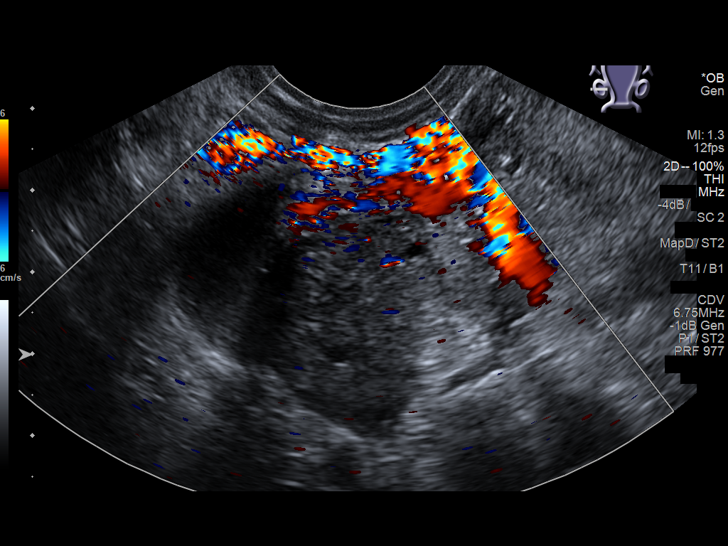
[im 85/96]
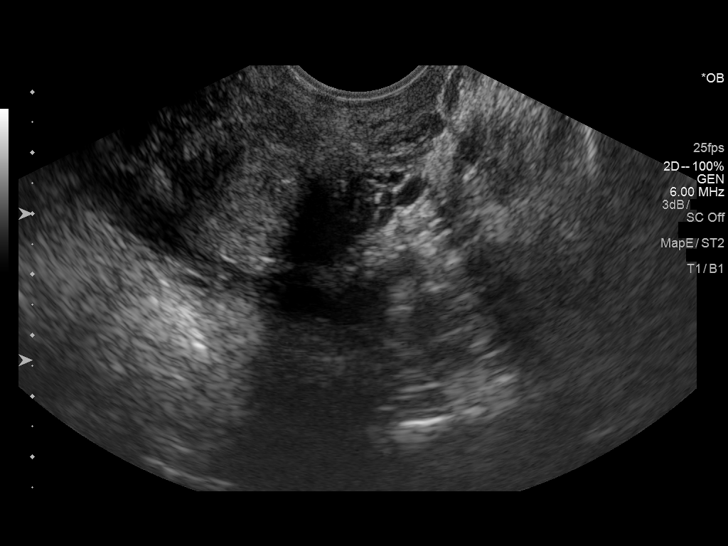
[im 92/96]
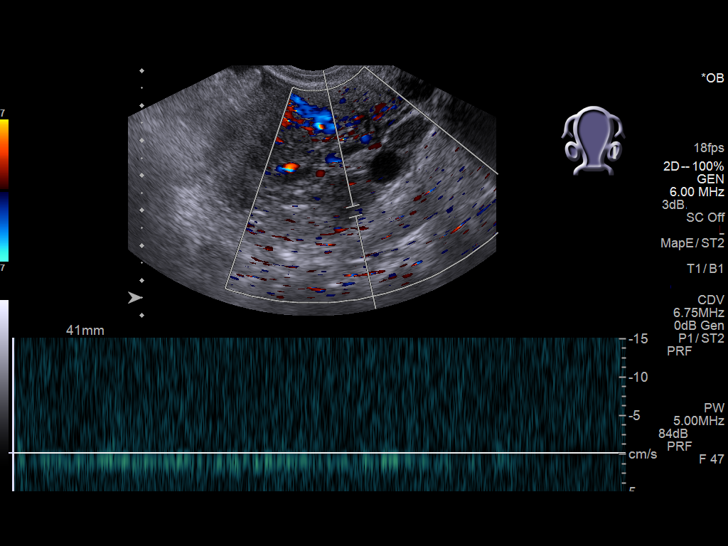

[13 of 28 positions shown; findings below may reference images not displayed]

FINDINGS: Intrauterine gestational sac: Not visible

Yolk sac:  Not visible

Embryo:  Not visible

Cardiac Activity: Not visible

Heart Rate:  bpm

MSD:   mm    w     d

CRL:     mm    w  d                  US EDC:

Subchorionic hemorrhage:  None visualized.

Maternal uterus/adnexae: Normal ovaries, with intact perfusion on
Doppler.

The endometrium is thickened and irregular, appearing hypervascular
on Doppler.

Moderate volume free pelvic fluid or blood.

Pulsed Doppler evaluation of both ovaries demonstrates normal
appearing low-resistance arterial and venous waveforms.
IMPRESSION: No visible gestational sac. Moderate volume blood or fluid in the
pelvis. Endometrium is thickened and inhomogeneous. Recommend close
follow-up.

## 2019-04-17 ENCOUNTER — Emergency Department
Admission: EM | Admit: 2019-04-17 | Discharge: 2019-04-17 | Disposition: A | Discharge status: Home / Self Care | Payer: Medicaid Other | Attending: Emergency Medicine | Admitting: Emergency Medicine

## 2019-04-17 ENCOUNTER — Encounter: Payer: Self-pay | Admitting: Emergency Medicine

## 2019-04-17 DIAGNOSIS — F17210 Nicotine dependence, cigarettes, uncomplicated: Secondary | ICD-10-CM | POA: Insufficient documentation

## 2019-04-17 DIAGNOSIS — B349 Viral infection, unspecified: Secondary | ICD-10-CM | POA: Insufficient documentation

## 2019-04-17 DIAGNOSIS — Z20828 Contact with and (suspected) exposure to other viral communicable diseases: Secondary | ICD-10-CM | POA: Insufficient documentation

## 2019-04-17 DIAGNOSIS — R109 Unspecified abdominal pain: Secondary | ICD-10-CM | POA: Diagnosis not present

## 2019-04-17 LAB — URINALYSIS, COMPLETE (UACMP) WITH MICROSCOPIC
Bacteria, UA: NONE SEEN
Bilirubin Urine: NEGATIVE
Glucose, UA: NEGATIVE mg/dL
Ketones, ur: NEGATIVE mg/dL
Leukocytes,Ua: NEGATIVE
Nitrite: NEGATIVE
Protein, ur: NEGATIVE mg/dL
Specific Gravity, Urine: 1.021 (ref 1.005–1.030)
pH: 5 (ref 5.0–8.0)

## 2019-04-17 LAB — CBC WITH DIFFERENTIAL/PLATELET
Abs Immature Granulocytes: 0.01 10*3/uL (ref 0.00–0.07)
Basophils Absolute: 0 10*3/uL (ref 0.0–0.1)
Basophils Relative: 1 %
Eosinophils Absolute: 0.2 10*3/uL (ref 0.0–0.5)
Eosinophils Relative: 3 %
HCT: 36.7 % (ref 36.0–46.0)
Hemoglobin: 11.7 g/dL — ABNORMAL LOW (ref 12.0–15.0)
Immature Granulocytes: 0 %
Lymphocytes Relative: 32 %
Lymphs Abs: 1.6 10*3/uL (ref 0.7–4.0)
MCH: 24.1 pg — ABNORMAL LOW (ref 26.0–34.0)
MCHC: 31.9 g/dL (ref 30.0–36.0)
MCV: 75.7 fL — ABNORMAL LOW (ref 80.0–100.0)
Monocytes Absolute: 0.3 10*3/uL (ref 0.1–1.0)
Monocytes Relative: 5 %
Neutro Abs: 3 10*3/uL (ref 1.7–7.7)
Neutrophils Relative %: 59 %
Platelets: 294 10*3/uL (ref 150–400)
RBC: 4.85 MIL/uL (ref 3.87–5.11)
RDW: 15.3 % (ref 11.5–15.5)
WBC: 5.1 10*3/uL (ref 4.0–10.5)
nRBC: 0 % (ref 0.0–0.2)

## 2019-04-17 LAB — COMPREHENSIVE METABOLIC PANEL
ALT: 17 U/L (ref 0–44)
AST: 19 U/L (ref 15–41)
Albumin: 4.1 g/dL (ref 3.5–5.0)
Alkaline Phosphatase: 60 U/L (ref 38–126)
Anion gap: 8 (ref 5–15)
BUN: 8 mg/dL (ref 6–20)
CO2: 23 mmol/L (ref 22–32)
Calcium: 8.9 mg/dL (ref 8.9–10.3)
Chloride: 106 mmol/L (ref 98–111)
Creatinine, Ser: 0.62 mg/dL (ref 0.44–1.00)
GFR calc Af Amer: >60 mL/min (ref >60)
GFR calc non Af Amer: >60 mL/min (ref >60)
Glucose, Bld: 98 mg/dL (ref 70–99)
Potassium: 3.8 mmol/L (ref 3.5–5.1)
Sodium: 137 mmol/L (ref 135–145)
Total Bilirubin: 0.6 mg/dL (ref 0.3–1.2)
Total Protein: 7.9 g/dL (ref 6.5–8.1)

## 2019-04-17 LAB — POCT PREGNANCY, URINE: Preg Test, Ur: NEGATIVE

## 2019-04-17 LAB — LIPASE, BLOOD: Lipase: 26 U/L (ref 11–51)

## 2019-04-17 LAB — SARS CORONAVIRUS 2 BY RT PCR (HOSPITAL ORDER, PERFORMED IN ~~LOC~~ HOSPITAL LAB): SARS Coronavirus 2: NEGATIVE

## 2019-04-17 MED ORDER — ONDANSETRON 4 MG PO TBDP: 4.0000 mg | ORAL_TABLET | Freq: Three times a day (TID) | ORAL | 0 refills | Status: AC | PRN

## 2019-04-17 MED ORDER — KETOROLAC TROMETHAMINE 30 MG/ML IJ SOLN
30.0000 mg | Freq: Once | INTRAMUSCULAR | Status: AC
  Administered 2019-04-17: 30 mg via INTRAVENOUS
  Filled 2019-04-17: qty 1

## 2019-04-17 MED ORDER — SODIUM CHLORIDE 0.9 % IV SOLN
Freq: Once | INTRAVENOUS | Status: AC
  Administered 2019-04-17: 10:00:00 via INTRAVENOUS

## 2019-04-17 MED ORDER — ONDANSETRON HCL 4 MG/2ML IJ SOLN
4.0000 mg | Freq: Once | INTRAMUSCULAR | Status: AC
  Administered 2019-04-17: 10:00:00 4 mg via INTRAVENOUS
  Filled 2019-04-17: qty 2

## 2019-04-17 NOTE — ED Notes (Signed)
Patient AAOX4. Vitals Stable. NAD. 

## 2019-04-17 NOTE — ED Provider Notes (Signed)
[facility] Emergency Department Provider Note       Time seen: ----------------------------------------- 8:48 AM on 04/17/2019 -----------------------------------------   I have reviewed the triage vital signs and the nursing notes.  HISTORY   Chief Complaint Abdominal Pain    HPI Wendy Brewer is a 28 y.o. female with no significant past medical history who presents to the ED for abdominal pain with nausea, vomiting or diarrhea since this morning.  Patient describes pain 6 out of 10, nothing makes it better or worse.  She has had chills, some cough and respiratory symptoms.  Mother was diagnosed during the past month with coronavirus.  No past medical history on file.  Patient Active Problem List   Diagnosis Date Noted  . Trichomonal vaginitis during pregnancy 10/18/2018  . History of prior pregnancy with IUGR newborn 10/12/2018  . Screening for malignant neoplasm of cervix 10/12/2018    No past surgical history on file.  Allergies Patient has no known allergies.  Social History Social History   Tobacco Use  . Smoking status: Current Some Day Smoker    Packs/day: 0.25  . Smokeless tobacco: Never Used  Substance Use Topics  . Alcohol use: Yes  . Drug use: No   Review of Systems Constitutional: Negative for fever.  Positive for chills Cardiovascular: Negative for chest pain. Respiratory: Negative for shortness of breath.  Positive for cough Gastrointestinal: Positive for abdominal pain, vomiting and diarrhea Musculoskeletal: Negative for back pain. Skin: Negative for rash. Neurological: Negative for headaches, focal weakness or numbness.  All systems negative/normal/unremarkable except as stated in the HPI  ____________________________________________   PHYSICAL EXAM:  VITAL SIGNS: ED Triage Vitals [04/17/19 0846]  Enc Vitals Group     BP      Pulse      Resp      Temp      Temp src      SpO2      Weight      Height       Head Circumference      Peak Flow      Pain Score 6     Pain Loc      Pain Edu?      Excl. in GC?    Constitutional: Alert and oriented. Well appearing and in no distress. Eyes: Conjunctivae are normal. Normal extraocular movements. ENT      Head: Normocephalic and atraumatic.      Nose: No congestion/rhinnorhea.      Mouth/Throat: Mucous membranes are moist.      Neck: No stridor. Cardiovascular: Normal rate, regular rhythm. No murmurs, rubs, or gallops. Respiratory: Normal respiratory effort without tachypnea nor retractions. Breath sounds are clear and equal bilaterally. No wheezes/rales/rhonchi. Gastrointestinal: Soft and nontender. Normal bowel sounds Musculoskeletal: Nontender with normal range of motion in extremities. No lower extremity tenderness nor edema. Neurologic:  Normal speech and language. No gross focal neurologic deficits are appreciated.  Skin:  Skin is warm, dry and intact. No rash noted. Psychiatric: Mood and affect are normal. Speech and behavior are normal.  ____________________________________________  ED COURSE:  As part of my medical decision making, I reviewed the following data within the electronic MEDICAL RECORD NUMBER History obtained from family if available, nursing notes, old chart and ekg, as well as notes from prior ED visits. Patient presented for abdominal pain as well as vomiting and diarrhea, we will assess with labs and imaging as indicated at this time.   Procedures  Wendy Brewer was evaluated  in Emergency Department on 04/17/2019 for the symptoms described in the history of present illness. She was evaluated in the context of the global COVID-19 pandemic, which necessitated consideration that the patient might be at risk for infection with the SARS-CoV-2 virus that causes COVID-19. Institutional protocols and algorithms that pertain to the evaluation of patients at risk for COVID-19 are in a state of rapid change based on information  released by regulatory bodies including the CDC and federal and state organizations. These policies and algorithms were followed during the patient's care in the ED.  ____________________________________________   LABS (pertinent positives/negatives)  Labs Reviewed  CBC WITH DIFFERENTIAL/PLATELET - Abnormal; Notable for the following components:      Result Value   Hemoglobin 11.7 (*)    MCV 75.7 (*)    MCH 24.1 (*)    All other components within normal limits  URINALYSIS, COMPLETE (UACMP) WITH MICROSCOPIC - Abnormal; Notable for the following components:   Color, Urine YELLOW (*)    APPearance HAZY (*)    Hgb urine dipstick SMALL (*)    All other components within normal limits  SARS CORONAVIRUS 2 (HOSPITAL ORDER, PERFORMED IN [facility])  COMPREHENSIVE METABOLIC PANEL  LIPASE, BLOOD  HIV ANTIBODY (ROUTINE TESTING W REFLEX)  POC URINE PREG, ED  POCT PREGNANCY, URINE   ____________________________________________   DIFFERENTIAL DIAGNOSIS   Gastroenteritis, dehydration, electrolyte abnormality, pregnancy  FINAL ASSESSMENT AND PLAN  Viral illness   Plan: The patient had presented for viral symptoms. Patient's labs were unremarkable, she was negative for coronavirus.  She be discharged with symptomatic treatment.  She is cleared for outpatient follow-up.    Ulice Dash, MD    Note: This note was generated in part or whole with voice recognition software. Voice recognition is usually quite accurate but there are transcription errors that can and very often do occur. I apologize for any typographical errors that were not detected and corrected.     Emily Filbert, MD 04/17/19 1146

## 2019-04-17 NOTE — ED Triage Notes (Signed)
Pt c/o abd pain with N/V/D since this morning

## 2019-04-18 LAB — HIV ANTIBODY (ROUTINE TESTING W REFLEX): HIV Screen 4th Generation wRfx: NONREACTIVE

## 2019-04-27 DIAGNOSIS — Z0000 Encounter for general adult medical examination without abnormal findings: Secondary | ICD-10-CM | POA: Diagnosis not present

## 2019-06-01 DIAGNOSIS — Z1388 Encounter for screening for disorder due to exposure to contaminants: Secondary | ICD-10-CM | POA: Diagnosis not present

## 2019-06-01 DIAGNOSIS — Z3009 Encounter for other general counseling and advice on contraception: Secondary | ICD-10-CM | POA: Diagnosis not present

## 2019-06-01 DIAGNOSIS — Z0389 Encounter for observation for other suspected diseases and conditions ruled out: Secondary | ICD-10-CM | POA: Diagnosis not present

## 2019-06-01 DIAGNOSIS — Z113 Encounter for screening for infections with a predominantly sexual mode of transmission: Secondary | ICD-10-CM | POA: Diagnosis not present

## 2019-06-01 DIAGNOSIS — Z418 Encounter for other procedures for purposes other than remedying health state: Secondary | ICD-10-CM | POA: Diagnosis not present

## 2019-06-02 DIAGNOSIS — N390 Urinary tract infection, site not specified: Secondary | ICD-10-CM | POA: Diagnosis not present

## 2019-06-26 ENCOUNTER — Encounter: Payer: Self-pay | Admitting: Physician Assistant

## 2019-06-29 ENCOUNTER — Telehealth: Payer: Self-pay

## 2019-06-29 NOTE — Telephone Encounter (Signed)
TC with patient re: [facility]NKA TX appts x 2. Pateint r/s for 07/05/19. Aileen Fass, RN

## 2019-07-03 DIAGNOSIS — F1520 Other stimulant dependence, uncomplicated: Secondary | ICD-10-CM

## 2019-07-03 DIAGNOSIS — F331 Major depressive disorder, recurrent, moderate: Secondary | ICD-10-CM

## 2019-07-03 DIAGNOSIS — E663 Overweight: Secondary | ICD-10-CM

## 2019-07-10 ENCOUNTER — Telehealth: Payer: Self-pay

## 2019-07-14 NOTE — Telephone Encounter (Signed)
Attempted TC to patient, call drops and never rings.  Aileen Fass, RN   Attempted TC to [phone] (# in [employer] EMR). RN left generic message requesting contact Aileen Fass, RN

## 2019-08-01 NOTE — Telephone Encounter (Signed)
[facility] Tx appt on 06/08/19, 06/09/19 and 07/05/19 Aileen Fass, RN   Needs chlamydia tx

## 2020-02-10 IMAGING — US US ART/VEN ABD/PELV/SCROTUM DOPPLER LTD
1 series · 14 of 25 positions shown · non-contrast
Comparison: None.

CLINICAL DATA: Pelvic pain in first-trimester pregnancy



[Series 1: us art/ven abd/pelv/scrotum doppler ltd · 14 of 123 slices shown]
[im 1/123]
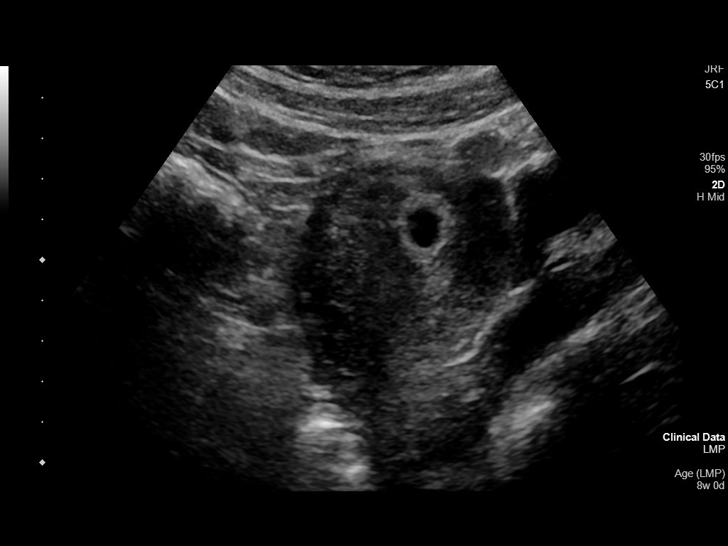
[im 11/123]
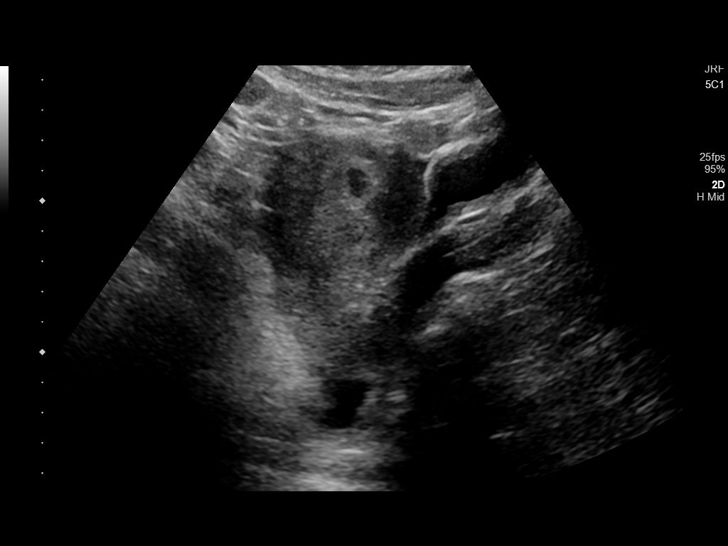
[im 21/123]
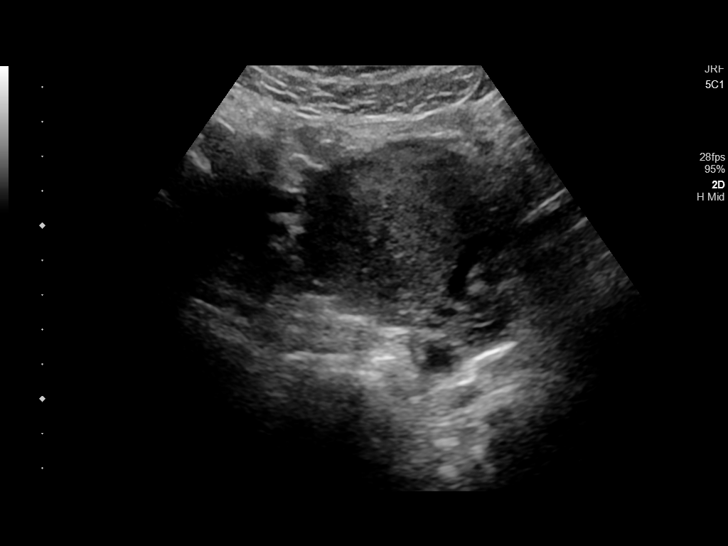
[im 31/123]
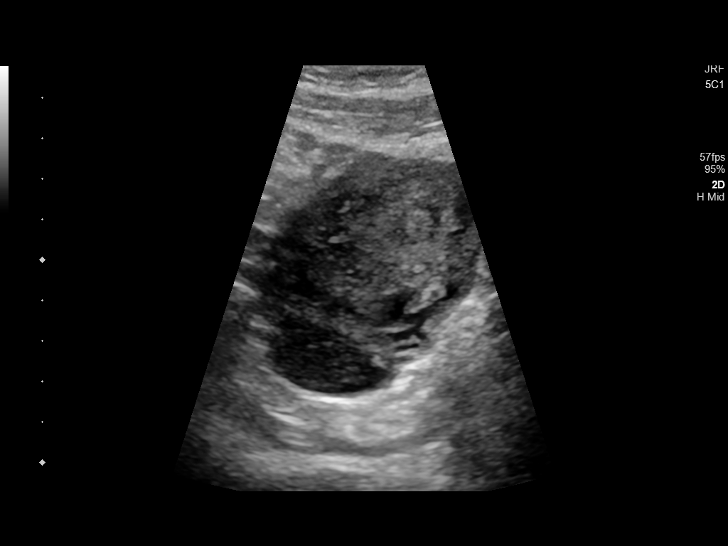
[im 41/123]
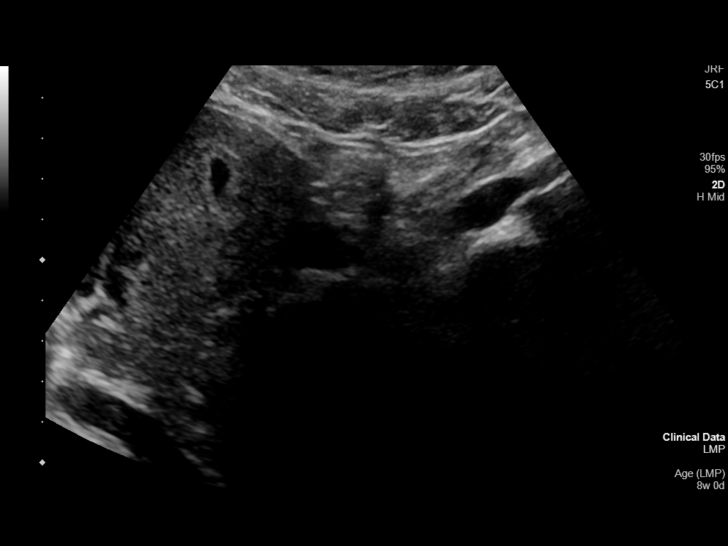
[im 46/123]
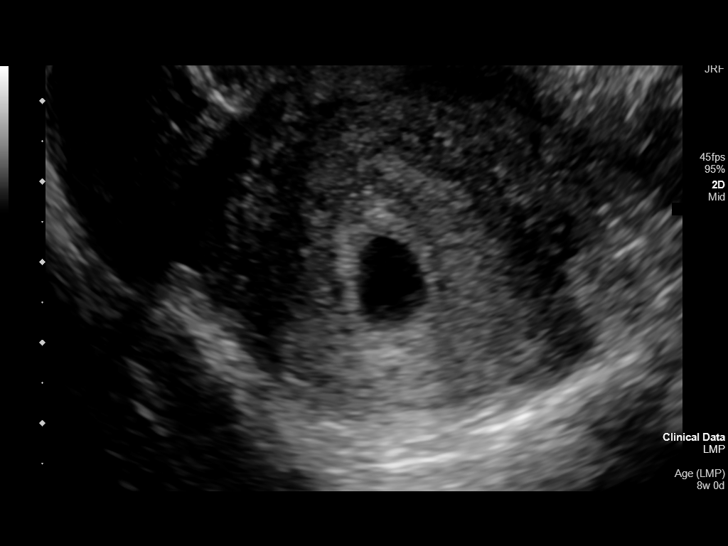
[im 56/123]
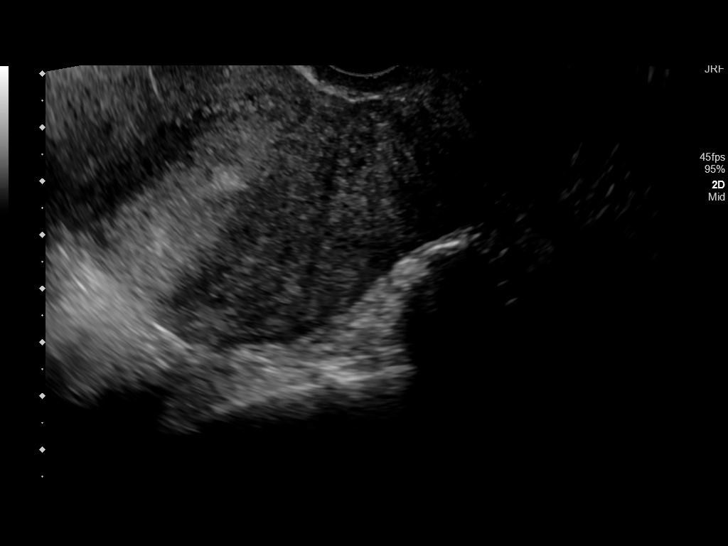
[im 67/123]
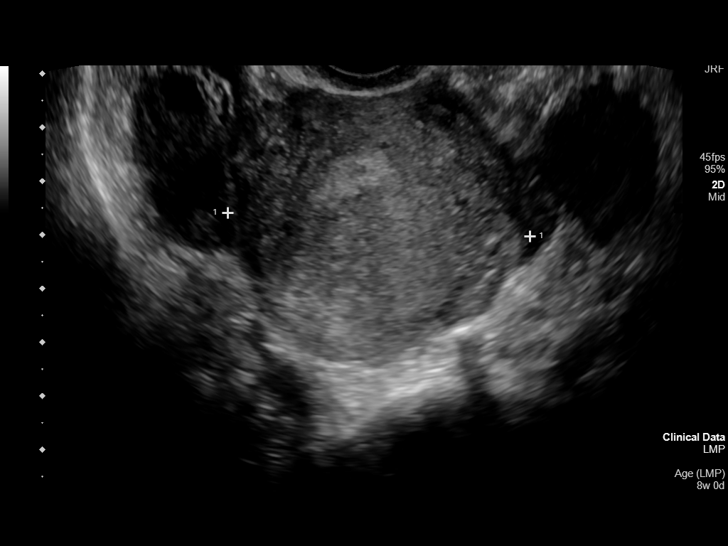
[im 77/123]
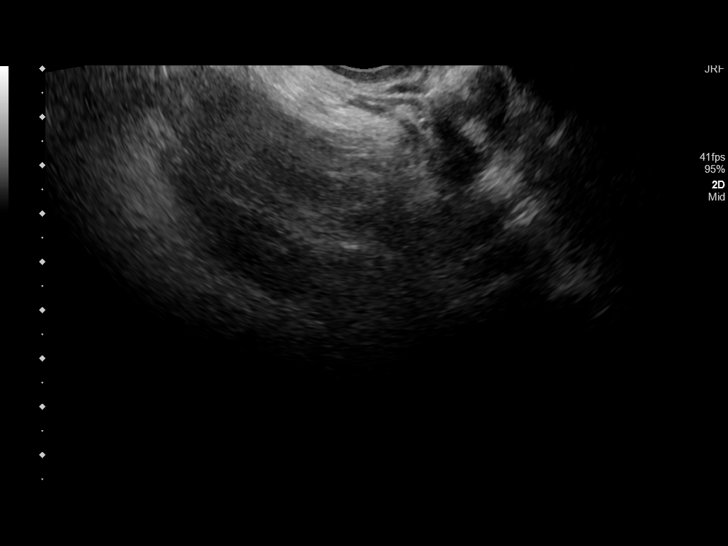
[im 82/123]
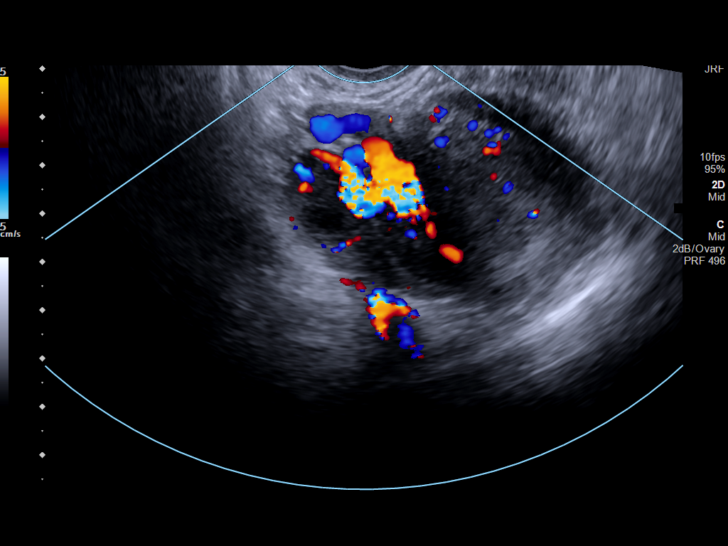
[im 92/123]
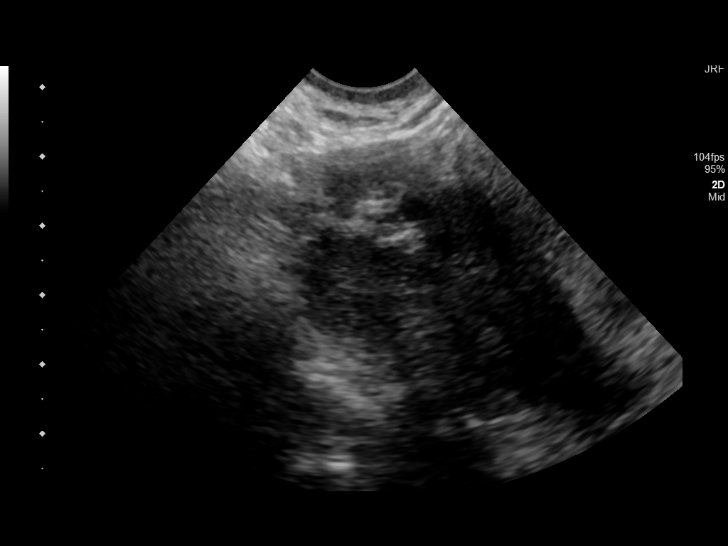
[im 102/123]
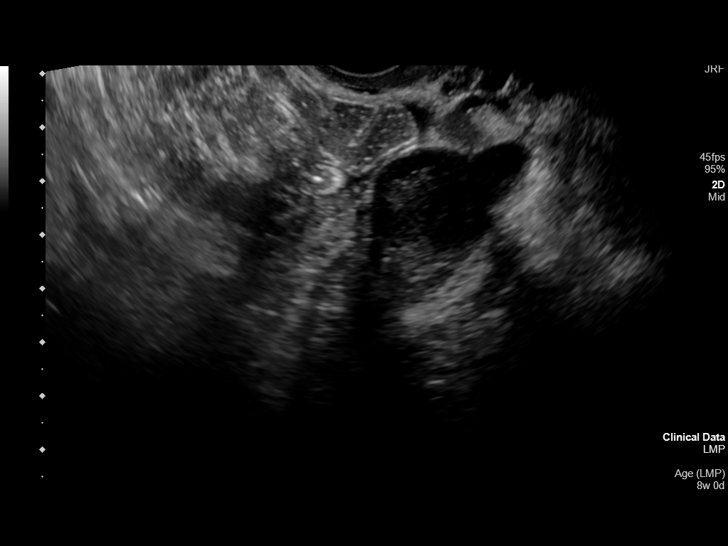
[im 112/123]
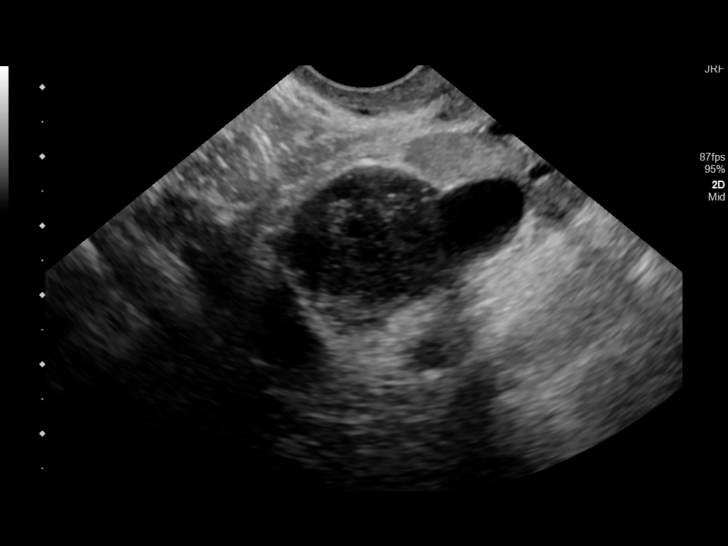
[im 123/123]
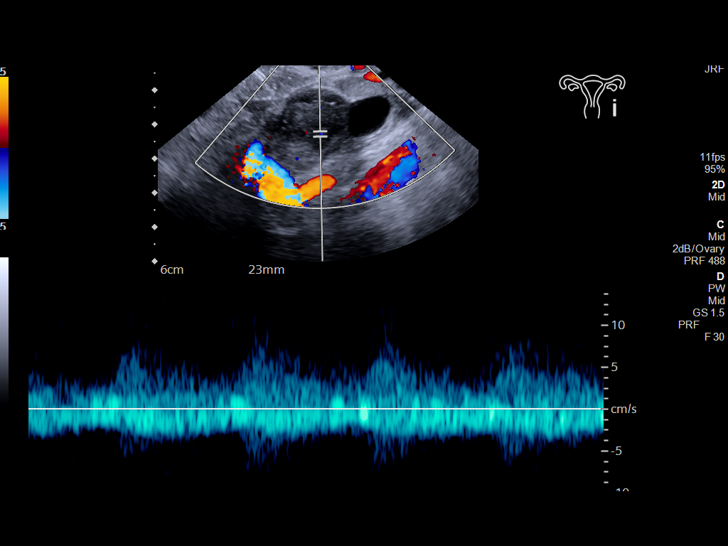

[14 of 25 positions shown; findings below may reference images not displayed]

FINDINGS: Intrauterine gestational sac: Present

Yolk sac:  Present

Embryo:  Not yet seen

MSD: 8.3 mm   5 w   4 d

Subchorionic hemorrhage:  None visualized.

Maternal uterus/adnexae: Corpus luteum on the right.

Pulsed Doppler evaluation of both ovaries demonstrates normal
appearing low-resistance arterial and venous waveforms.
IMPRESSION: 1. Intrauterine gestational sac with yolk sac, 5 weeks 4 days by
mean sac diameter. An embryo is not yet visualized.
2. Normal ovarian Doppler.

## 2020-03-28 DIAGNOSIS — N760 Acute vaginitis: Secondary | ICD-10-CM | POA: Diagnosis not present

## 2020-03-28 DIAGNOSIS — Z113 Encounter for screening for infections with a predominantly sexual mode of transmission: Secondary | ICD-10-CM | POA: Diagnosis not present

## 2020-03-28 DIAGNOSIS — Z202 Contact with and (suspected) exposure to infections with a predominantly sexual mode of transmission: Secondary | ICD-10-CM | POA: Diagnosis not present

## 2020-03-28 DIAGNOSIS — B9689 Other specified bacterial agents as the cause of diseases classified elsewhere: Secondary | ICD-10-CM | POA: Diagnosis not present

## 2020-03-28 DIAGNOSIS — N898 Other specified noninflammatory disorders of vagina: Secondary | ICD-10-CM | POA: Diagnosis not present

## 2020-08-26 ENCOUNTER — Other Ambulatory Visit: Payer: Self-pay

## 2020-08-26 ENCOUNTER — Encounter (HOSPITAL_COMMUNITY): Payer: Self-pay

## 2020-08-26 ENCOUNTER — Emergency Department (HOSPITAL_COMMUNITY)
Admission: EM | Admit: 2020-08-26 | Discharge: 2020-08-26 | Disposition: A | Discharge status: Home / Self Care | Payer: Medicaid Other | Attending: Emergency Medicine | Admitting: Emergency Medicine

## 2020-08-26 DIAGNOSIS — R0981 Nasal congestion: Secondary | ICD-10-CM | POA: Insufficient documentation

## 2020-08-26 DIAGNOSIS — R05 Cough: Secondary | ICD-10-CM | POA: Insufficient documentation

## 2020-08-26 DIAGNOSIS — Z20822 Contact with and (suspected) exposure to covid-19: Secondary | ICD-10-CM

## 2020-08-26 DIAGNOSIS — R5383 Other fatigue: Secondary | ICD-10-CM | POA: Insufficient documentation

## 2020-08-26 DIAGNOSIS — J3489 Other specified disorders of nose and nasal sinuses: Secondary | ICD-10-CM | POA: Diagnosis not present

## 2020-08-26 DIAGNOSIS — Z87891 Personal history of nicotine dependence: Secondary | ICD-10-CM | POA: Diagnosis not present

## 2020-08-26 DIAGNOSIS — M7918 Myalgia, other site: Secondary | ICD-10-CM | POA: Insufficient documentation

## 2020-08-26 DIAGNOSIS — J029 Acute pharyngitis, unspecified: Secondary | ICD-10-CM | POA: Insufficient documentation

## 2020-08-26 DIAGNOSIS — R059 Cough, unspecified: Secondary | ICD-10-CM

## 2020-08-26 DIAGNOSIS — J392 Other diseases of pharynx: Secondary | ICD-10-CM | POA: Diagnosis not present

## 2020-08-26 LAB — SARS CORONAVIRUS 2 BY RT PCR (HOSPITAL ORDER, PERFORMED IN ~~LOC~~ HOSPITAL LAB): SARS Coronavirus 2: NEGATIVE

## 2020-08-26 NOTE — ED Provider Notes (Signed)
[facility]-EMERGENCY DEPT Provider Note   CSN: [id number] Arrival date & time: 08/26/20  1115     History Chief Complaint  Patient presents with   Covid Exposure   Sore Throat   No taste   Nasal Congestion    Wendy Brewer is a 29 y.o. female.  Wendy Brewer is a 29 y.o. female With a history of depression, who presents to the emergency department for evaluation of sore throat, cough, congestion and fatigue.  She also reports loss of taste and smell.  Symptoms have been present for 3 days after she was in close contact with her aunt who she later found out was Covid positive.  She denies shortness of breath or chest pain reports some pain with cough.  Cough has been dry and nonproductive.  She denies nausea, vomiting or abdominal pain, no diarrhea.  She has not had her Covid vaccine.  Has not taken any medications to treat her symptoms.  Her daughters are both here with her with similar symptoms.        History reviewed. No pertinent past medical history.  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 No pertinent surgical history.   OB History    Gravida  6   Para  2   Term  2   Preterm      AB  4   Living  2     SAB  3   TAB  1   Ectopic      Multiple      Live Births  2           Family History  Problem Relation Age of Onset   Healthy Mother    Healthy Father     Social History   Tobacco Use   Smoking status: Former Smoker    Packs/day: 0.25   Smokeless tobacco: Never Used  Building services engineer Use: Former  Substance Use Topics   Alcohol use: Yes   Drug use: No      Home Medications Prior to Admission medications   Medication Sig Start Date End Date Taking? Authorizing Provider  etonogestrel (NEXPLANON) 68 MG IMPL implant 1 each by Subdermal route once. 11/21/18   Federico Flake, MD  ondansetron (ZOFRAN ODT) 4 MG disintegrating tablet Take 1 tablet (4 mg total) by mouth every 6 (six) hours as needed for nausea. Patient not taking: Reported on 04/17/2019 10/07/18   Vena Austria, MD  ondansetron (ZOFRAN ODT) 4 MG disintegrating tablet Take 1 tablet (4 mg total) by mouth every 8 (eight) hours as needed for nausea or vomiting. 04/17/19   Emily Filbert, MD    Allergies    Patient has no known allergies.  Review of Systems   Review of Systems  Constitutional: Positive for fatigue. Negative for chills and fever.  HENT: Positive for congestion, rhinorrhea and sore throat.   Respiratory: Positive for cough. Negative for shortness of breath.   Cardiovascular: Negative for chest pain.  Gastrointestinal: Negative for abdominal pain, diarrhea, nausea and vomiting.  Musculoskeletal: Positive for myalgias.  Skin: Negative for rash.  Neurological: Negative for headaches.  All other systems reviewed and are negative.  Physical Exam Updated Vital Signs BP 124/74 (BP Location: Left Arm)    Pulse 95    Temp 99.3 F (37.4 C) (Oral)    Resp 16    Ht 5\' 7"  (1.702 m)    Wt 77.1 kg    SpO2 97%    BMI 26.63 kg/m   Physical Exam Vitals and nursing note reviewed.  Constitutional:      General: She is not in acute distress.    Appearance: She is well-developed. She is not ill-appearing or toxic-appearing.     Comments: Well-appearing and in no distress  HENT:     Head: Normocephalic and atraumatic.     Nose: Congestion and rhinorrhea present.     Mouth/Throat:     Mouth: Mucous membranes are moist.     Pharynx: Oropharynx is clear. Posterior oropharyngeal erythema present. No pharyngeal swelling or oropharyngeal exudate.     Tonsils: No  tonsillar abscesses.  Cardiovascular:     Rate and Rhythm: Normal rate and regular rhythm.     Heart sounds: Normal heart sounds. No murmur heard.  No friction rub. No gallop.   Pulmonary:     Effort: Pulmonary effort is normal. No respiratory distress.     Breath sounds: Normal breath sounds. No stridor. No wheezing or rales.     Comments: Respirations equal and unlabored, patient able to speak in full sentences, lungs clear to auscultation bilaterally Abdominal:     General: Bowel sounds are normal. There is no distension.     Palpations: Abdomen is soft. There is no mass.     Tenderness: There is no abdominal tenderness. There is no rebound.     Comments: Abdomen soft, nondistended, nontender to palpation in all quadrants without guarding or peritoneal signs  Skin:    General: Skin is warm and dry.  Neurological:     Mental Status: She is alert and oriented to person, place, and time.  Psychiatric:        Mood and Affect: Mood normal.        Behavior: Behavior normal.     ED Results / Procedures / Treatments   Labs (all labs ordered are listed, but only abnormal results are displayed) Labs Reviewed  SARS CORONAVIRUS 2 BY RT PCR (HOSPITAL ORDER, PERFORMED IN [facility])    EKG None  Radiology No results found.  Procedures Procedures (including critical care time)  Medications Ordered in ED Medications - No data to display  ED Course  I have reviewed the triage vital signs and the nursing notes.  Pertinent labs & imaging results that were available during my care of the patient were reviewed by me and considered in my medical decision making (see chart for details).    MDM Rules/Calculators/A&P                          29 year old female presents with fatigue, cough, rhinorrhea, sore throat after being in close contact with a family member who was found to be Covid positive.  She has had symptoms for 3 days and has lost taste and smell.  She has not  had her Covid vaccine.  Her daughters are here with similar symptoms and they were in contact with the same family member.  Patient is overall well-appearing with normal vitals.  Lungs are clear and patient is not hypoxic.  Covid testing sent, discussed appropriate quarantine guidelines and symptomatic treatment at home.  Strict return precautions  discussed.  Patient expresses understanding and agreement with plan.  Discharged home in good condition.  Patient is aware she will be called if her Covid results are positive.   Wendy Brewer was evaluated in Emergency Department on 08/26/2020 for the symptoms described in the history of present illness. She was evaluated in the context of the global COVID-19 pandemic, which necessitated consideration that the patient might be at risk for infection with the SARS-CoV-2 virus that causes COVID-19. Institutional protocols and algorithms that pertain to the evaluation of patients at risk for COVID-19 are in a state of rapid change based on information released by regulatory bodies including the CDC and federal and state organizations. These policies and algorithms were followed during the patient's care in the ED.  Final Clinical Impression(s) / ED Diagnoses Final diagnoses:  Exposure to COVID-19 virus  Sore throat  Cough    Rx / DC Orders ED Discharge Orders    None       Dartha Lodge, [place] 08/26/20 1742    Sabino Donovan, MD 08/27/20 [phone]

## 2020-08-26 NOTE — Discharge Instructions (Signed)
You have tested positive for COVID-19 virus.  Please continue to quarantine at home and monitor your symptoms closely. Antibiotics are not helpful in treating viral infection, the virus should run its course in about 10-14 days. Please make sure you are drinking plenty of fluids. You can treat your symptoms supportively with tylenol for fevers and pains, and over the counter cough syrups and throat lozenges to help with cough.  You can use over-the-counter Zyrtec and Flonase to help with nasal congestion.  If your symptoms are not improving please follow up with you Primary doctor.   I recommend that you purchase a home pulse ox to help better monitor your oxygen at home, if you start to have increased work of breathing or shortness of breath or your oxygen drops below 92% please immediately return to the hospital for reevaluation.  If you develop persistent fevers, shortness of breath or difficulty breathing, chest pain, severe headache and neck pain, persistent nausea and vomiting or other new or concerning symptoms return to the Emergency department.

## 2020-08-26 NOTE — ED Triage Notes (Signed)
Patient states Covid exposure. Patient c/o sore throat, nasal congestion, No taste and smell x 3 days.

## 2020-08-27 ENCOUNTER — Telehealth: Payer: Self-pay | Admitting: *Deleted

## 2020-08-27 NOTE — Telephone Encounter (Signed)
Transition Care Management Unsuccessful Follow-up Telephone Call  Date of discharge and from where:  08/26/20, [facility]  Attempts:  1st Attempt  Reason for unsuccessful TCM follow-up call:  Missing or invalid number.  Burnard Bunting, RN, BSN, CCRN Patient Engagement Center [phone]

## 2020-08-27 NOTE — Telephone Encounter (Signed)
Email sent to [facility] to request follow up for NP appointment.  [name]     PEC [phone] 

## 2020-08-28 ENCOUNTER — Telehealth: Payer: Self-pay | Admitting: *Deleted

## 2020-08-28 NOTE — Telephone Encounter (Signed)
Contacted patient to contact Transition of Care Assessment. She requested to be called back because she has taken sleep medication. Unable to complete assessment at this time.  Burnard Bunting, RN, BSN, CCRN Patient Engagement Center [phone]

## 2020-08-29 ENCOUNTER — Telehealth: Payer: Self-pay | Admitting: *Deleted

## 2020-08-29 NOTE — Telephone Encounter (Signed)
Transition Care Management Unsuccessful Follow-up Telephone Call  Date of discharge and from where:  08/26/20, [facility]  Attempts:  3rd Attempt  Reason for unsuccessful TCM follow-up call:  Unable to leave message.  Burnard Bunting, RN, BSN, CCRN Patient Engagement Center [phone]

## 2021-02-13 ENCOUNTER — Other Ambulatory Visit: Payer: Self-pay

## 2021-02-13 ENCOUNTER — Ambulatory Visit (LOCAL_COMMUNITY_HEALTH_CENTER): Payer: Medicaid Other | Admitting: Advanced Practice Midwife

## 2021-02-13 VITALS — BP 111/77 | HR 91 | Ht 67.0 in | Wt 178.2 lb

## 2021-02-13 DIAGNOSIS — Z3046 Encounter for surveillance of implantable subdermal contraceptive: Secondary | ICD-10-CM | POA: Diagnosis not present

## 2021-02-13 DIAGNOSIS — Z3009 Encounter for other general counseling and advice on contraception: Secondary | ICD-10-CM | POA: Diagnosis not present

## 2021-02-13 DIAGNOSIS — F331 Major depressive disorder, recurrent, moderate: Secondary | ICD-10-CM

## 2021-02-13 LAB — WET PREP FOR TRICH, YEAST, CLUE
Trichomonas Exam: NEGATIVE
Yeast Exam: NEGATIVE

## 2021-02-13 MED ORDER — ETONOGESTREL 68 MG ~~LOC~~ IMPL
68.0000 mg | DRUG_IMPLANT | Freq: Once | SUBCUTANEOUS | Status: AC
  Administered 2021-02-13: 68 mg via SUBCUTANEOUS

## 2021-02-13 NOTE — Progress Notes (Signed)
Presents for PE, STD screen and Nexplanon Removal/Reinsertion. Wet prep reviewed with provider, no treatment indicated per standing order. Nexplanon removal/reinsertion tolerated well. Sharlyne Pacas, RN

## 2021-02-13 NOTE — Progress Notes (Signed)
[facility] [address] Main Number: [phone]    Family Planning Visit- Initial Visit  Subjective:  Wendy Brewer is a 30 y.o. SBF [id number] (7,8) exsmoker  being seen today for an initial well woman visit and to discuss family planning options.  She is currently using Nexplanon for pregnancy prevention. Patient reports she does not want a pregnancy in the next year.  Patient has the following medical conditions has History of prior pregnancy with IUGR newborn; Screening for malignant neoplasm of cervix; Trichomonal vaginitis during pregnancy; Caffeine dependence (HCC); Overweight BMI=27.9; and Major depressive disorder, recurrent episode, moderate (HCC) on their problem list.  Chief Complaint  Patient presents with  . Contraception    Nexplanon removal/reinsertioj    Patient reports wants PE and Nexplanon removal/reinsertion today.  LMP end January 2022.  Last sex 02/02/21 with condom; with current partner x 2 mo. 2 partners in last 3 mo. Last pap 11/28/18 neg.  Nexplanon inserted 01/2018.  Last cig 2020.  Last vaped 02/09/21. Last ETOH 02/07/21 (3 shots Tequila) q weekend.  Patient denies MJ, cigars  Body mass index is 27.91 kg/m. - Patient is eligible for diabetes screening based on BMI and age >71?  not applicable HA1C ordered? not applicable  Patient reports 2  partner/s in last year. Desires STI screening?  Yes  Has patient been screened once for HCV in the past?  No  No results found for: HCVAB  Does the patient have current drug use (including MJ), have a partner with drug use, and/or has been incarcerated since last result? No  If yes-- Screen for HCV through  [facility]   Does the patient meet criteria for HBV testing? No  Criteria:  -Household, sexual or needle sharing contact with HBV -History of drug use -HIV positive -Those with known Hep C   Health Maintenance Due  Topic Date Due  . Hepatitis C  Screening  Never done  . COVID-19 Vaccine (1) Never done  . TETANUS/TDAP  Never done  . INFLUENZA VACCINE  Never done    Review of Systems  All other systems reviewed and are negative.   The following portions of the patient's history were reviewed and updated as appropriate: allergies, current medications, past family history, past medical history, past social history, past surgical history and problem list. Problem list updated.   See flowsheet for other program required questions.  Objective:   Vitals:   02/13/21 1126  BP: 111/77  Pulse: 91  Weight: 178 lb 3.2 oz (80.8 kg)  Height: 5\' 7"  (1.702 m)    Physical Exam Constitutional:      Appearance: Normal appearance. She is normal weight.  HENT:     Head: Normocephalic and atraumatic.     Mouth/Throat:     Mouth: Mucous membranes are moist.  Eyes:     Conjunctiva/sclera: Conjunctivae normal.  Cardiovascular:     Rate and Rhythm: Normal rate and regular rhythm.  Pulmonary:     Effort: Pulmonary effort is normal.     Breath sounds: Normal breath sounds.  Chest:  Breasts:     Right: Normal.     Left: Normal.    Abdominal:     Palpations: Abdomen is soft.     Comments: Poor tone, soft without masses or tenderness  Genitourinary:    General: Normal vulva.     Exam position: Lithotomy position.     Vagina: Vaginal discharge (grey scant leukorrhea, ph>4.5) present.  Cervix: Normal.     Uterus: Normal.      Adnexa: Right adnexa normal and left adnexa normal.     Rectum: Normal.  Musculoskeletal:        General: Normal range of motion.     Cervical back: Normal range of motion and neck supple.  Skin:    General: Skin is warm and dry.  Neurological:     Mental Status: She is alert.  Psychiatric:        Mood and Affect: Mood normal.       Assessment and Plan:  [name] is a 30 y.o. female presenting to the [facility] Department for an initial well woman exam/family planning  visit  Contraception counseling: Reviewed all forms of birth control options in the tiered based approach. available including abstinence; over the counter/barrier methods; hormonal contraceptive medication including pill, patch, ring, injection,contraceptive implant, ECP; hormonal and nonhormonal IUDs; permanent sterilization options including vasectomy and the various tubal sterilization modalities. Risks, benefits, and typical effectiveness rates were reviewed.  Questions were answered.  Written information was also given to the patient to review.  Patient desires Nexplanon removal/reinsertion, this was prescribed for patient. She will follow up in  1 year for surveillance.  She was told to call with any further questions, or with any concerns about this method of contraception.  Emphasized use of condoms 100% of the time for STI prevention.  Patient was offered ECP. ECP was not accepted by the patient. ECP counseling was not given - see RN documentation  1. Family planning Please give pt Kathreen Cosier contact card Treat wet mount per standing orders Immunization nurse consult - WET PREP FOR TRICH, YEAST, CLUE - Chlamydia/Gonorrhea [facility]  2. Encounter for surveillance of implantable subdermal contraceptive Nexplanon Removal and Insertion  Patient identified, informed consent performed, consent signed.   Patient does understand that irregular bleeding is a very common side effect of this medication. She was advised to have backup contraception for one week after replacement of the implant. Patient deemed to meet WHO criteria for being reasonably certain she is not pregnant.  Appropriate time out taken. Nexplanon site identified. Area prepped in usual sterile fashon. 3 ml of 1% lidocaine with epinephrine was used to anesthetize the area at the distal end of the implant. A small stab incision was made right beside the implant on the distal portion. The Nexplanon rod was grasped using hemostats  and removed without difficulty. There was minimal blood loss. There were no complications.   Confirmed correct location of insertion site. The insertion site was identified 8-10 cm (3-4 inches) from the medial epicondyle of the humerus and 3-5 cm (1.25-2 inches) posterior to (below) the sulcus (groove) between the biceps and triceps muscles of the patient's left arm. New Nexplanon removed from packaging, Device confirmed in needle, then inserted full length of needle and withdrawn per handbook instructions. Nexplanon was able to palpated in the patient's left arm; patient palpated the insert herself.  There was minimal blood loss. Patient insertion site covered with guaze and a pressure bandage to reduce any bruising. The patient tolerated the procedure well and was given post procedure instructions.   Nexplanon:   Counseled patient to take OTC analgesic starting as soon as lidocaine starts to wear off and take regularly for at least 48 hr to decrease discomfort.  Specifically to take with food or milk to decrease stomach upset and for IB 600 mg (3 tablets) every 6 hrs; IB  800 mg (4 tablets) every 8 hrs; or Aleve 2 tablets every 12 hrs.       No follow-ups on file.  No future appointments.  Alberteen Spindle, CNM

## 2021-02-13 NOTE — Addendum Note (Signed)
Addended by: Sharlyne Pacas on: 02/13/2021 05:08 PM   Modules accepted: Orders

## 2022-09-11 DIAGNOSIS — N390 Urinary tract infection, site not specified: Secondary | ICD-10-CM | POA: Diagnosis not present

## 2022-09-11 DIAGNOSIS — Z3202 Encounter for pregnancy test, result negative: Secondary | ICD-10-CM | POA: Diagnosis not present

## 2022-09-11 DIAGNOSIS — R350 Frequency of micturition: Secondary | ICD-10-CM | POA: Diagnosis not present

## 2022-09-11 DIAGNOSIS — N898 Other specified noninflammatory disorders of vagina: Secondary | ICD-10-CM | POA: Diagnosis not present

## 2023-04-12 ENCOUNTER — Telehealth: Payer: Self-pay

## 2023-04-12 NOTE — Telephone Encounter (Signed)
LVM for patient to call back. AS, CMA 

## 2023-08-01 DIAGNOSIS — R1030 Lower abdominal pain, unspecified: Secondary | ICD-10-CM | POA: Diagnosis not present

## 2023-08-16 DIAGNOSIS — K922 Gastrointestinal hemorrhage, unspecified: Secondary | ICD-10-CM | POA: Diagnosis not present

## 2023-08-23 DIAGNOSIS — K831 Obstruction of bile duct: Secondary | ICD-10-CM | POA: Diagnosis not present

## 2023-08-23 DIAGNOSIS — R1013 Epigastric pain: Secondary | ICD-10-CM | POA: Diagnosis not present

## 2023-08-23 DIAGNOSIS — K625 Hemorrhage of anus and rectum: Secondary | ICD-10-CM | POA: Diagnosis not present

## 2023-08-23 DIAGNOSIS — R911 Solitary pulmonary nodule: Secondary | ICD-10-CM | POA: Diagnosis not present

## 2023-08-23 DIAGNOSIS — R140 Abdominal distension (gaseous): Secondary | ICD-10-CM | POA: Diagnosis not present

## 2023-08-24 DIAGNOSIS — R1013 Epigastric pain: Secondary | ICD-10-CM | POA: Diagnosis not present

## 2023-08-24 DIAGNOSIS — R911 Solitary pulmonary nodule: Secondary | ICD-10-CM | POA: Diagnosis not present

## 2023-08-24 DIAGNOSIS — K625 Hemorrhage of anus and rectum: Secondary | ICD-10-CM | POA: Diagnosis not present

## 2023-08-24 DIAGNOSIS — K831 Obstruction of bile duct: Secondary | ICD-10-CM | POA: Diagnosis not present

## 2024-02-11 ENCOUNTER — Ambulatory Visit: Payer: Medicaid Other

## 2024-03-08 ENCOUNTER — Telehealth: Payer: Self-pay | Admitting: Family Medicine

## 2024-03-08 NOTE — Telephone Encounter (Signed)
 Patient called in requesting information on the nexplanon implant and more specifically when it expires and the strength of the nexplanon at that point. Please call and advise on nexplanon.

## 2024-03-15 ENCOUNTER — Ambulatory Visit

## 2024-04-10 ENCOUNTER — Telehealth: Payer: Self-pay | Admitting: Family Medicine

## 2024-04-10 NOTE — Telephone Encounter (Signed)
 Pt has been LVM to call back

## 2024-04-10 NOTE — Telephone Encounter (Signed)
 Patient called in asking about the time frame that nexplanon  is good for. She remembers being told 4-5 years but read online that it was only good for 3 years. Would like a call to answer that questions. Patient was also concerned due to not usually having a period while on nexplanon  and has gotten one for the first time 3 days ago

## 2024-08-15 ENCOUNTER — Ambulatory Visit

## 2024-08-22 ENCOUNTER — Ambulatory Visit

## 2024-09-04 ENCOUNTER — Ambulatory Visit
